# Patient Record
Sex: Female | Born: 1955
Health system: Southern US, Community
[De-identification: ages and names within clinical notes are randomized; demographics above are authoritative.]

## PROBLEM LIST (undated history)

## (undated) DIAGNOSIS — E785 Hyperlipidemia, unspecified: Secondary | ICD-10-CM

## (undated) DIAGNOSIS — G4730 Sleep apnea, unspecified: Secondary | ICD-10-CM

## (undated) DIAGNOSIS — E119 Type 2 diabetes mellitus without complications: Secondary | ICD-10-CM

## (undated) DIAGNOSIS — I10 Essential (primary) hypertension: Secondary | ICD-10-CM

## (undated) DIAGNOSIS — L0390 Cellulitis, unspecified: Secondary | ICD-10-CM

## (undated) HISTORY — DX: Hyperlipidemia, unspecified: E78.5

## (undated) HISTORY — DX: Essential (primary) hypertension: I10

## (undated) HISTORY — DX: Cellulitis, unspecified: L03.90

## (undated) HISTORY — DX: Type 2 diabetes mellitus without complications: E11.9

## (undated) HISTORY — DX: Sleep apnea, unspecified: G47.30

---

## 2007-01-29 ENCOUNTER — Ambulatory Visit: Payer: Self-pay | Admitting: Internal Medicine

## 2008-02-12 ENCOUNTER — Ambulatory Visit: Payer: Self-pay | Admitting: Internal Medicine

## 2008-11-05 ENCOUNTER — Ambulatory Visit: Payer: Self-pay | Admitting: Internal Medicine

## 2009-02-23 ENCOUNTER — Ambulatory Visit: Payer: Self-pay | Admitting: Internal Medicine

## 2009-05-20 ENCOUNTER — Ambulatory Visit: Payer: Self-pay | Admitting: Internal Medicine

## 2010-03-08 ENCOUNTER — Ambulatory Visit: Payer: Self-pay

## 2011-04-18 ENCOUNTER — Ambulatory Visit: Payer: Self-pay

## 2012-04-22 ENCOUNTER — Ambulatory Visit: Payer: Self-pay

## 2013-05-11 ENCOUNTER — Ambulatory Visit: Payer: Self-pay | Admitting: Physician Assistant

## 2014-04-06 ENCOUNTER — Emergency Department: Payer: Self-pay | Admitting: Emergency Medicine

## 2015-03-22 ENCOUNTER — Ambulatory Visit (INDEPENDENT_AMBULATORY_CARE_PROVIDER_SITE_OTHER): Payer: BLUE CROSS/BLUE SHIELD

## 2015-03-22 ENCOUNTER — Ambulatory Visit (INDEPENDENT_AMBULATORY_CARE_PROVIDER_SITE_OTHER): Payer: BLUE CROSS/BLUE SHIELD | Admitting: Podiatry

## 2015-03-22 ENCOUNTER — Telehealth: Payer: Self-pay | Admitting: *Deleted

## 2015-03-22 DIAGNOSIS — M722 Plantar fascial fibromatosis: Secondary | ICD-10-CM

## 2015-03-22 MED ORDER — MELOXICAM 15 MG PO TABS: 15.0000 mg | ORAL_TABLET | Freq: Every day | ORAL | Status: DC

## 2015-03-22 NOTE — Patient Instructions (Signed)

## 2015-03-22 NOTE — Telephone Encounter (Signed)
Pt states she was suppose to get something to stretch her foot, but did not.  I asked Hadley PenLisa Cox Dr. Gabriel RungWagoner's assistant and she said pt was to get the stretching instructions only.  I informed pt.

## 2015-03-23 DIAGNOSIS — M722 Plantar fascial fibromatosis: Secondary | ICD-10-CM | POA: Insufficient documentation

## 2015-03-23 NOTE — Progress Notes (Signed)
Subjective:     Patient ID: Makayla OvensSusan M Harrison, female   DOB: Dec 21, 1955, 59 y.o.   MRN: [id number]  HPI 59 year old female presents the also concerned to right heel pain which has been ongoing for approximately 3 weeks. She states that several years ago she did have plantar fasciitis on the same foot. She states she has a throbbing pain to the bottom of her heel mostly after she sits for long time he gets back up in the mornings or after sitting for long period time. She previously had orthotics and she is inquiring about possible new orthotics. She denies any numbness or tingling. No swelling or redness. No recent injury or trauma. No change or increased activity. The pain does not like crepitus. No other treatment recently. No other complaints.  Review of Systems  All other systems reviewed and are negative.      Objective:   Physical Exam General: AAO x3, NAD  Dermatological: Skin is warm, dry and supple bilateral. Nails x 10 are well manicured; remaining integument appears unremarkable at this time. There are no open sores, no preulcerative lesions, no rash or signs of infection present.  Vascular: Dorsalis Pedis artery and Posterior Tibial artery pedal pulses are 2/4 bilateral with immedate capillary fill time. Pedal hair growth present. No varicosities and no lower extremity edema present bilateral. There is no pain with calf compression, swelling, warmth, erythema.   Neruologic: Grossly intact via light touch bilateral. Vibratory intact via tuning fork bilateral. Protective threshold with Semmes Wienstein monofilament intact to all pedal sites bilateral. Patellar and Achilles deep tendon reflexes 2+ bilateral. No Babinski or clonus noted bilateral.   Musculoskeletal: Tenderness to palpation along the plantar medial tubercle of the calcaneus at the insertion of plantar fascia on the right foot. There is no pain along the course of the plantar fascia within the arch of the foot. Plantar fascia  appears to be intact. There is no pain with lateral compression of the calcaneus or pain with vibratory sensation. There is no pain along the course or insertion of the achilles tendon. No other areas of tenderness to bilateral lower extremities.  Muscular strength 5/5 in all groups tested bilateral.  Gait: Unassisted, Nonantalgic.      Assessment:     Patient presents her right heel pain, likely plantar fasciitis.    Plan:     -X-rays were obtained and reviewed with the patient.  -Treatment options discussed including all alternatives, risks, and complications -X-rays were obtained and reviewed with the patient.  -Patient elects to proceed with steroid injection into the right heel. Under sterile skin preparation, a total of 2.5cc of kenalog 10, 0.5% Marcaine plain, and 2% lidocaine plain were infiltrated into the symptomatic area without complication. A band-aid was applied. Patient tolerated the injection well without complication. Post-injection care with discussed with the patient. Discussed with the patient to ice the area over the next couple of days to help prevent a steroid flare.  -Plantar fascial brace -Prescribed mobic. Discussed side effects of the medication and directed to stop if any are to occur and call the office.  -Ice/stretcing -Shoegear changes -Discussed orthotics. She was scanned today, awaiting insurance verification.  -Follow-up in 3 weeks or sooner if any problems arise. In the meantime, encouraged to call the office with any questions, concerns, change in symptoms.   Makayla CurdMatthew Harrison, DPM

## 2015-03-28 ENCOUNTER — Ambulatory Visit: Payer: BLUE CROSS/BLUE SHIELD | Admitting: Podiatry

## 2015-04-12 ENCOUNTER — Ambulatory Visit: Payer: BLUE CROSS/BLUE SHIELD | Admitting: Podiatry

## 2015-05-06 ENCOUNTER — Ambulatory Visit (INDEPENDENT_AMBULATORY_CARE_PROVIDER_SITE_OTHER): Payer: BLUE CROSS/BLUE SHIELD | Admitting: *Deleted

## 2015-05-06 DIAGNOSIS — M722 Plantar fascial fibromatosis: Secondary | ICD-10-CM

## 2015-05-06 NOTE — Patient Instructions (Signed)

## 2015-05-06 NOTE — Progress Notes (Signed)
Patient presents today to pick up orthotics. Instructions were reviewed and written copy was given. Return follow up in 4 weeks with Dr. Ardelle Anton.

## 2015-07-20 DIAGNOSIS — I10 Essential (primary) hypertension: Secondary | ICD-10-CM | POA: Diagnosis not present

## 2015-07-27 DIAGNOSIS — I6603 Occlusion and stenosis of bilateral middle cerebral arteries: Secondary | ICD-10-CM | POA: Diagnosis not present

## 2015-09-13 DIAGNOSIS — E559 Vitamin D deficiency, unspecified: Secondary | ICD-10-CM | POA: Diagnosis not present

## 2015-09-13 DIAGNOSIS — E782 Mixed hyperlipidemia: Secondary | ICD-10-CM | POA: Diagnosis not present

## 2015-09-13 DIAGNOSIS — Z0001 Encounter for general adult medical examination with abnormal findings: Secondary | ICD-10-CM | POA: Diagnosis not present

## 2015-09-13 DIAGNOSIS — E1165 Type 2 diabetes mellitus with hyperglycemia: Secondary | ICD-10-CM | POA: Diagnosis not present

## 2015-09-13 DIAGNOSIS — I10 Essential (primary) hypertension: Secondary | ICD-10-CM | POA: Diagnosis not present

## 2015-09-19 DIAGNOSIS — Z0001 Encounter for general adult medical examination with abnormal findings: Secondary | ICD-10-CM | POA: Diagnosis not present

## 2015-09-19 DIAGNOSIS — E1165 Type 2 diabetes mellitus with hyperglycemia: Secondary | ICD-10-CM | POA: Diagnosis not present

## 2015-09-19 DIAGNOSIS — I10 Essential (primary) hypertension: Secondary | ICD-10-CM | POA: Diagnosis not present

## 2015-09-19 DIAGNOSIS — Z124 Encounter for screening for malignant neoplasm of cervix: Secondary | ICD-10-CM | POA: Diagnosis not present

## 2015-09-19 DIAGNOSIS — I6603 Occlusion and stenosis of bilateral middle cerebral arteries: Secondary | ICD-10-CM | POA: Diagnosis not present

## 2015-12-19 DIAGNOSIS — F4323 Adjustment disorder with mixed anxiety and depressed mood: Secondary | ICD-10-CM | POA: Diagnosis not present

## 2016-01-09 DIAGNOSIS — F4323 Adjustment disorder with mixed anxiety and depressed mood: Secondary | ICD-10-CM | POA: Diagnosis not present

## 2016-01-13 ENCOUNTER — Encounter: Payer: Self-pay | Admitting: *Deleted

## 2016-02-13 DIAGNOSIS — F4323 Adjustment disorder with mixed anxiety and depressed mood: Secondary | ICD-10-CM | POA: Diagnosis not present

## 2016-03-12 DIAGNOSIS — E782 Mixed hyperlipidemia: Secondary | ICD-10-CM | POA: Diagnosis not present

## 2016-03-12 DIAGNOSIS — I10 Essential (primary) hypertension: Secondary | ICD-10-CM | POA: Diagnosis not present

## 2016-03-12 DIAGNOSIS — E1165 Type 2 diabetes mellitus with hyperglycemia: Secondary | ICD-10-CM | POA: Diagnosis not present

## 2016-03-12 DIAGNOSIS — F4323 Adjustment disorder with mixed anxiety and depressed mood: Secondary | ICD-10-CM | POA: Diagnosis not present

## 2016-04-16 DIAGNOSIS — F4323 Adjustment disorder with mixed anxiety and depressed mood: Secondary | ICD-10-CM | POA: Diagnosis not present

## 2016-04-30 DIAGNOSIS — F4323 Adjustment disorder with mixed anxiety and depressed mood: Secondary | ICD-10-CM | POA: Diagnosis not present

## 2016-05-21 DIAGNOSIS — F4323 Adjustment disorder with mixed anxiety and depressed mood: Secondary | ICD-10-CM | POA: Diagnosis not present

## 2016-06-18 DIAGNOSIS — F4323 Adjustment disorder with mixed anxiety and depressed mood: Secondary | ICD-10-CM | POA: Diagnosis not present

## 2016-06-18 DIAGNOSIS — E1165 Type 2 diabetes mellitus with hyperglycemia: Secondary | ICD-10-CM | POA: Diagnosis not present

## 2016-06-18 DIAGNOSIS — E782 Mixed hyperlipidemia: Secondary | ICD-10-CM | POA: Diagnosis not present

## 2016-06-18 DIAGNOSIS — I10 Essential (primary) hypertension: Secondary | ICD-10-CM | POA: Diagnosis not present

## 2016-07-23 DIAGNOSIS — E1165 Type 2 diabetes mellitus with hyperglycemia: Secondary | ICD-10-CM | POA: Diagnosis not present

## 2016-07-23 DIAGNOSIS — I10 Essential (primary) hypertension: Secondary | ICD-10-CM | POA: Diagnosis not present

## 2016-08-06 ENCOUNTER — Ambulatory Visit: Payer: BLUE CROSS/BLUE SHIELD | Admitting: Medical

## 2016-08-06 ENCOUNTER — Encounter: Payer: Self-pay | Admitting: Medical

## 2016-08-06 VITALS — BP 130/80 | HR 73 | Temp 98.9°F | Resp 16 | Ht 72.0 in | Wt 306.0 lb

## 2016-08-06 DIAGNOSIS — J0110 Acute frontal sinusitis, unspecified: Secondary | ICD-10-CM

## 2016-08-06 MED ORDER — AMOXICILLIN-POT CLAVULANATE 875-125 MG PO TABS: 1.0000 | ORAL_TABLET | Freq: Two times a day (BID) | ORAL | 0 refills | Status: DC

## 2016-08-06 NOTE — Progress Notes (Signed)
   Subjective:    Patient ID: Makayla Harrison, female    DOB: [birth year], 61 y.o.   MRN: [id number]  HPI  61 yo female comes in today with cough nonproductive , 99 degree temp last night. And nasal congestion with green discharge. Sore throat started on Saturday. Facial pressure behind eyes.    Review of Systems  Constitutional: Positive for chills and fever.  HENT: Positive for congestion, ear pain, postnasal drip, rhinorrhea, sinus pain, sinus pressure, sneezing, sore throat and voice change.   Eyes: Negative for pain and itching.  Respiratory: Positive for cough. Negative for chest tightness and shortness of breath.   Cardiovascular: Negative for chest pain.  Gastrointestinal: Negative for diarrhea, nausea and vomiting.  Endocrine: Negative for cold intolerance and heat intolerance.  Genitourinary: Negative for dysuria.  Musculoskeletal: Negative for back pain and neck pain.  Skin: Negative for rash and wound.  Allergic/Immunologic: Negative for environmental allergies and food allergies.  Neurological: Negative for syncope and light-headedness.  Psychiatric/Behavioral: Negative for hallucinations. The patient is not nervous/anxious.    Cough noted in room    Objective:   Physical Exam  Constitutional: She is oriented to person, place, and time. She appears well-developed and well-nourished.  HENT:  Head: Normocephalic and atraumatic.  Right Ear: Hearing, tympanic membrane, external ear and ear canal normal.  Left Ear: Hearing, tympanic membrane, external ear and ear canal normal.  Nose: Mucosal edema and rhinorrhea present.  Mouth/Throat: Uvula is midline, oropharynx is clear and moist and mucous membranes are normal.  Eyes: EOM are normal. Pupils are equal, round, and reactive to light.  Neck: Normal range of motion. Neck supple.  Cardiovascular: Normal rate, regular rhythm and normal heart sounds.  Exam reveals no gallop and no friction rub.   No murmur  heard. Pulmonary/Chest: Effort normal and breath sounds normal.  Musculoskeletal: Normal range of motion.  Neurological: She is alert and oriented to person, place, and time.  Skin: Skin is warm and dry.  Psychiatric: She has a normal mood and affect. Her behavior is normal.  Nursing note and vitals reviewed.  Discharge in nose is green.       Assessment & Plan:  Sinusitis E-prescribed  Augmentin   one twice daily  X 10 days #20 no refills. Increase fluids. OTC Zyrtec or Claritin take as directed. Return to the clinic in 3-5 days if not improving.  Patient denies pregnancy

## 2016-08-20 DIAGNOSIS — F4323 Adjustment disorder with mixed anxiety and depressed mood: Secondary | ICD-10-CM | POA: Diagnosis not present

## 2016-10-09 DIAGNOSIS — D509 Iron deficiency anemia, unspecified: Secondary | ICD-10-CM | POA: Diagnosis not present

## 2016-10-09 DIAGNOSIS — E559 Vitamin D deficiency, unspecified: Secondary | ICD-10-CM | POA: Diagnosis not present

## 2016-10-09 DIAGNOSIS — I10 Essential (primary) hypertension: Secondary | ICD-10-CM | POA: Diagnosis not present

## 2016-10-09 DIAGNOSIS — Z0001 Encounter for general adult medical examination with abnormal findings: Secondary | ICD-10-CM | POA: Diagnosis not present

## 2016-10-09 DIAGNOSIS — E782 Mixed hyperlipidemia: Secondary | ICD-10-CM | POA: Diagnosis not present

## 2016-10-15 DIAGNOSIS — E782 Mixed hyperlipidemia: Secondary | ICD-10-CM | POA: Diagnosis not present

## 2016-10-15 DIAGNOSIS — B079 Viral wart, unspecified: Secondary | ICD-10-CM | POA: Diagnosis not present

## 2016-10-15 DIAGNOSIS — E1165 Type 2 diabetes mellitus with hyperglycemia: Secondary | ICD-10-CM | POA: Diagnosis not present

## 2016-10-15 DIAGNOSIS — I10 Essential (primary) hypertension: Secondary | ICD-10-CM | POA: Diagnosis not present

## 2016-12-31 DIAGNOSIS — F4323 Adjustment disorder with mixed anxiety and depressed mood: Secondary | ICD-10-CM | POA: Diagnosis not present

## 2017-01-14 DIAGNOSIS — Z0001 Encounter for general adult medical examination with abnormal findings: Secondary | ICD-10-CM | POA: Diagnosis not present

## 2017-01-14 DIAGNOSIS — B359 Dermatophytosis, unspecified: Secondary | ICD-10-CM | POA: Diagnosis not present

## 2017-01-14 DIAGNOSIS — E11621 Type 2 diabetes mellitus with foot ulcer: Secondary | ICD-10-CM | POA: Diagnosis not present

## 2017-01-14 DIAGNOSIS — I10 Essential (primary) hypertension: Secondary | ICD-10-CM | POA: Diagnosis not present

## 2017-02-04 DIAGNOSIS — F4323 Adjustment disorder with mixed anxiety and depressed mood: Secondary | ICD-10-CM | POA: Diagnosis not present

## 2017-02-18 DIAGNOSIS — B351 Tinea unguium: Secondary | ICD-10-CM | POA: Diagnosis not present

## 2017-02-18 DIAGNOSIS — E119 Type 2 diabetes mellitus without complications: Secondary | ICD-10-CM | POA: Diagnosis not present

## 2017-02-18 DIAGNOSIS — M2042 Other hammer toe(s) (acquired), left foot: Secondary | ICD-10-CM | POA: Diagnosis not present

## 2017-02-18 DIAGNOSIS — M2041 Other hammer toe(s) (acquired), right foot: Secondary | ICD-10-CM | POA: Diagnosis not present

## 2017-02-18 DIAGNOSIS — Z1231 Encounter for screening mammogram for malignant neoplasm of breast: Secondary | ICD-10-CM | POA: Diagnosis not present

## 2017-02-26 ENCOUNTER — Other Ambulatory Visit: Payer: Self-pay | Admitting: Nurse Practitioner

## 2017-02-27 ENCOUNTER — Inpatient Hospital Stay
Admission: RE | Admit: 2017-02-27 | Discharge: 2017-02-27 | Disposition: A | Discharge status: Home / Self Care | Payer: Self-pay | Source: Ambulatory Visit | Attending: *Deleted | Admitting: *Deleted

## 2017-02-27 ENCOUNTER — Other Ambulatory Visit: Payer: Self-pay | Admitting: *Deleted

## 2017-02-27 DIAGNOSIS — Z9289 Personal history of other medical treatment: Secondary | ICD-10-CM

## 2017-04-15 ENCOUNTER — Ambulatory Visit: Payer: Self-pay | Admitting: Nurse Practitioner

## 2017-04-15 ENCOUNTER — Other Ambulatory Visit: Payer: Self-pay

## 2017-04-15 DIAGNOSIS — I6603 Occlusion and stenosis of bilateral middle cerebral arteries: Secondary | ICD-10-CM | POA: Insufficient documentation

## 2017-04-15 DIAGNOSIS — E1165 Type 2 diabetes mellitus with hyperglycemia: Secondary | ICD-10-CM | POA: Insufficient documentation

## 2017-04-15 DIAGNOSIS — E782 Mixed hyperlipidemia: Secondary | ICD-10-CM | POA: Insufficient documentation

## 2017-04-15 DIAGNOSIS — E11621 Type 2 diabetes mellitus with foot ulcer: Secondary | ICD-10-CM | POA: Insufficient documentation

## 2017-04-15 DIAGNOSIS — B359 Dermatophytosis, unspecified: Secondary | ICD-10-CM | POA: Insufficient documentation

## 2017-04-15 DIAGNOSIS — R0602 Shortness of breath: Secondary | ICD-10-CM | POA: Insufficient documentation

## 2017-04-15 DIAGNOSIS — I2510 Atherosclerotic heart disease of native coronary artery without angina pectoris: Secondary | ICD-10-CM | POA: Insufficient documentation

## 2017-04-15 DIAGNOSIS — F4323 Adjustment disorder with mixed anxiety and depressed mood: Secondary | ICD-10-CM | POA: Diagnosis not present

## 2017-04-15 DIAGNOSIS — N958 Other specified menopausal and perimenopausal disorders: Secondary | ICD-10-CM | POA: Insufficient documentation

## 2017-04-15 DIAGNOSIS — R079 Chest pain, unspecified: Secondary | ICD-10-CM | POA: Insufficient documentation

## 2017-04-15 DIAGNOSIS — I10 Essential (primary) hypertension: Secondary | ICD-10-CM | POA: Insufficient documentation

## 2017-04-15 DIAGNOSIS — E559 Vitamin D deficiency, unspecified: Secondary | ICD-10-CM | POA: Insufficient documentation

## 2017-04-15 DIAGNOSIS — I2583 Coronary atherosclerosis due to lipid rich plaque: Secondary | ICD-10-CM

## 2017-04-18 ENCOUNTER — Other Ambulatory Visit: Payer: Self-pay | Admitting: Nurse Practitioner

## 2017-04-18 ENCOUNTER — Telehealth: Payer: Self-pay

## 2017-04-18 DIAGNOSIS — R922 Inconclusive mammogram: Secondary | ICD-10-CM

## 2017-04-18 NOTE — Telephone Encounter (Signed)
Pt called regarding her second breast exam order. [facility] was sent [facility]/17, [facility]. Advised pt to call them in a few hours to check if they received it.

## 2017-04-25 ENCOUNTER — Other Ambulatory Visit: Payer: Self-pay

## 2017-04-29 ENCOUNTER — Encounter (HOSPITAL_COMMUNITY): Payer: Self-pay

## 2017-04-29 ENCOUNTER — Ambulatory Visit
Admission: RE | Admit: 2017-04-29 | Discharge: 2017-04-29 | Disposition: A | Discharge status: Home / Self Care | Payer: BLUE CROSS/BLUE SHIELD | Source: Ambulatory Visit | Attending: Nurse Practitioner | Admitting: Nurse Practitioner

## 2017-04-29 DIAGNOSIS — R922 Inconclusive mammogram: Secondary | ICD-10-CM

## 2017-04-29 DIAGNOSIS — N6459 Other signs and symptoms in breast: Secondary | ICD-10-CM | POA: Diagnosis not present

## 2017-04-29 DIAGNOSIS — R928 Other abnormal and inconclusive findings on diagnostic imaging of breast: Secondary | ICD-10-CM | POA: Diagnosis not present

## 2017-05-13 DIAGNOSIS — F4323 Adjustment disorder with mixed anxiety and depressed mood: Secondary | ICD-10-CM | POA: Diagnosis not present

## 2017-05-27 ENCOUNTER — Other Ambulatory Visit: Payer: Self-pay

## 2017-05-27 MED ORDER — TERBINAFINE HCL 250 MG PO TABS: 250.0000 mg | ORAL_TABLET | Freq: Every day | ORAL | 1 refills | Status: DC

## 2017-06-10 DIAGNOSIS — F4323 Adjustment disorder with mixed anxiety and depressed mood: Secondary | ICD-10-CM | POA: Diagnosis not present

## 2017-07-15 DIAGNOSIS — F4323 Adjustment disorder with mixed anxiety and depressed mood: Secondary | ICD-10-CM | POA: Diagnosis not present

## 2017-09-23 DIAGNOSIS — F4323 Adjustment disorder with mixed anxiety and depressed mood: Secondary | ICD-10-CM | POA: Diagnosis not present

## 2017-09-30 ENCOUNTER — Ambulatory Visit: Payer: Self-pay | Admitting: Nurse Practitioner

## 2017-10-10 ENCOUNTER — Other Ambulatory Visit: Payer: Self-pay | Admitting: Internal Medicine

## 2017-10-11 ENCOUNTER — Other Ambulatory Visit: Payer: Self-pay | Admitting: Internal Medicine

## 2017-11-11 ENCOUNTER — Ambulatory Visit: Payer: Self-pay | Admitting: Nurse Practitioner

## 2017-11-25 ENCOUNTER — Other Ambulatory Visit: Payer: Self-pay

## 2017-11-25 MED ORDER — METFORMIN HCL 1000 MG PO TABS: 1000.0000 mg | ORAL_TABLET | Freq: Two times a day (BID) | ORAL | 1 refills | Status: DC

## 2017-11-27 ENCOUNTER — Other Ambulatory Visit: Payer: Self-pay | Admitting: Internal Medicine

## 2017-12-01 ENCOUNTER — Other Ambulatory Visit: Payer: Self-pay | Admitting: Internal Medicine

## 2017-12-02 ENCOUNTER — Other Ambulatory Visit: Payer: Self-pay

## 2017-12-02 MED ORDER — LISINOPRIL 10 MG PO TABS: 10.0000 mg | ORAL_TABLET | Freq: Every day | ORAL | 1 refills | Status: DC

## 2017-12-16 ENCOUNTER — Ambulatory Visit: Payer: BLUE CROSS/BLUE SHIELD | Admitting: Nurse Practitioner

## 2017-12-16 ENCOUNTER — Encounter: Payer: Self-pay | Admitting: Nurse Practitioner

## 2017-12-16 VITALS — BP 138/70 | HR 67 | Resp 16 | Ht 72.0 in | Wt 307.4 lb

## 2017-12-16 DIAGNOSIS — E1165 Type 2 diabetes mellitus with hyperglycemia: Secondary | ICD-10-CM | POA: Diagnosis not present

## 2017-12-16 DIAGNOSIS — I10 Essential (primary) hypertension: Secondary | ICD-10-CM

## 2017-12-16 DIAGNOSIS — Z0001 Encounter for general adult medical examination with abnormal findings: Secondary | ICD-10-CM

## 2017-12-16 DIAGNOSIS — E782 Mixed hyperlipidemia: Secondary | ICD-10-CM | POA: Diagnosis not present

## 2017-12-16 DIAGNOSIS — I6523 Occlusion and stenosis of bilateral carotid arteries: Secondary | ICD-10-CM | POA: Diagnosis not present

## 2017-12-16 DIAGNOSIS — Z1239 Encounter for other screening for malignant neoplasm of breast: Secondary | ICD-10-CM

## 2017-12-16 DIAGNOSIS — E559 Vitamin D deficiency, unspecified: Secondary | ICD-10-CM

## 2017-12-16 DIAGNOSIS — Z1231 Encounter for screening mammogram for malignant neoplasm of breast: Secondary | ICD-10-CM

## 2017-12-16 LAB — POCT GLYCOSYLATED HEMOGLOBIN (HGB A1C): HEMOGLOBIN A1C: 7.3 % — AB (ref 4.0–5.6)

## 2017-12-16 NOTE — Progress Notes (Signed)
[facility] [address] [place], [place] [address]  Internal MEDICINE  Office Visit Note  Patient Name: Makayla Harrison  [id number]  [id number]  Date of Service: 12/25/2017  Chief Complaint  Patient presents with  . Diabetes  . Foot Pain    right     Diabetes  She presents for her follow-up diabetic visit. She has type 2 diabetes mellitus. No MedicAlert identification noted. Her disease course has been stable. There are no hypoglycemic associated symptoms. Pertinent negatives for hypoglycemia include no dizziness, headaches, nervousness/anxiousness or tremors. There are no diabetic associated symptoms. Pertinent negatives for diabetes include no chest pain, no fatigue, no polydipsia, no polyphagia and no polyuria. There are no hypoglycemic complications. Symptoms are stable. Diabetic complications include PVD. Risk factors for coronary artery disease include diabetes mellitus, dyslipidemia, hypertension, obesity, post-menopausal and sedentary lifestyle. Current diabetic treatment includes oral agent (dual therapy). She is compliant with treatment all of the time. Her weight is stable. She is following a generally healthy diet. Meal planning includes avoidance of concentrated sweets. She has not had a previous visit with a dietitian. She participates in exercise intermittently. There is no change in her home blood glucose trend. An ACE inhibitor/angiotensin II receptor blocker is being taken. She does not see a podiatrist.Eye exam is current.       Current Medication: Outpatient Encounter Medications as of 12/16/2017  Medication Sig  . aspirin 81 MG chewable tablet Chew 81 mg by mouth daily.  . carvedilol (COREG) 25 MG tablet TAKE 1 TABLET BY MOUTH TWICE A DAY AS NEEDED FOR BLOOD PRESSURE  . cephALEXin (KEFLEX) 500 MG capsule Take 500 mg by mouth 4 (four) times daily.  . famciclovir (FAMVIR) 500 MG tablet Take 500 mg by mouth 2 (two) times daily.  Marland Kitchen glimepiride (AMARYL) 1 MG  tablet TAKE 1 TABLET BY MOUTH EVERY DAY  . glucose blood test strip 1 each by Other route as needed for other. Use as instructed  . Insulin Pen Needle (NOVOFINE PLUS) 32G X 4 MM MISC by Does not apply route.  Marland Kitchen LIPITOR 20 MG tablet TAKE 1 TABLET AT PM WITH SUPPER - NAME BRAND IS MEDICALLY NECESSARY  . lisinopril (PRINIVIL,ZESTRIL) 10 MG tablet TAKE 1 TABLET BY MOUTH EVERY DAY  . lisinopril (PRINIVIL,ZESTRIL) 10 MG tablet Take 1 tablet (10 mg total) by mouth daily.  . metFORMIN (GLUCOPHAGE) 1000 MG tablet Take 1 tablet (1,000 mg total) by mouth 2 (two) times daily.  Marland Kitchen terbinafine (LAMISIL) 250 MG tablet Take 1 tablet (250 mg total) by mouth daily.  Marland Kitchen amoxicillin-clavulanate (AUGMENTIN) 875-125 MG tablet Take 1 tablet by mouth 2 (two) times daily. Take with food (Patient not taking: Reported on 12/16/2017)   No facility-administered encounter medications on file as of 12/16/2017.     Surgical History: History reviewed. No pertinent surgical history.  Medical History: Past Medical History:  Diagnosis Date  . Cellulitis   . Diabetes mellitus without complication (HCC)   . Hyperlipidemia   . Hypertension   . Sleep apnea     Family History: Family History  Problem Relation Age of Onset  . Breast cancer Neg Hx   . Osteoarthritis Neg Hx   . Hypertension Neg Hx   . Renal cancer Neg Hx   . Heart disease Neg Hx   . Diabetes Neg Hx   . Hyperlipidemia Neg Hx     Social History   Socioeconomic History  . Marital status: Married    Spouse name:  Not on file  . Number of children: Not on file  . Years of education: Not on file  . Highest education level: Not on file  Occupational History  . Not on file  Social Needs  . Financial resource strain: Not on file  . Food insecurity:    Worry: Not on file    Inability: Not on file  . Transportation needs:    Medical: Not on file    Non-medical: Not on file  Tobacco Use  . Smoking status: Former Games developer  . Smokeless tobacco: Never Used   Substance and Sexual Activity  . Alcohol use: Yes    Comment: many years ago socially  . Drug use: No  . Sexual activity: Not on file  Lifestyle  . Physical activity:    Days per week: Not on file    Minutes per session: Not on file  . Stress: Not on file  Relationships  . Social connections:    Talks on phone: Not on file    Gets together: Not on file    Attends religious service: Not on file    Active member of club or organization: Not on file    Attends meetings of clubs or organizations: Not on file    Relationship status: Not on file  . Intimate partner violence:    Fear of current or ex partner: Not on file    Emotionally abused: Not on file    Physically abused: Not on file    Forced sexual activity: Not on file  Other Topics Concern  . Not on file  Social History Narrative  . Not on file      Review of Systems  Constitutional: Negative for chills, fatigue and unexpected weight change.  HENT: Negative for congestion, postnasal drip, rhinorrhea, sneezing and sore throat.   Eyes: Negative.  Negative for redness.  Respiratory: Negative for cough, chest tightness, shortness of breath and wheezing.   Cardiovascular: Negative for chest pain and palpitations.  Gastrointestinal: Negative for abdominal pain, constipation, diarrhea, nausea and vomiting.  Endocrine: Negative for cold intolerance, heat intolerance, polydipsia, polyphagia and polyuria.  Genitourinary: Negative for dysuria and frequency.  Musculoskeletal: Positive for arthralgias. Negative for back pain, joint swelling and neck pain.       Patient mentions bilateral foot pain, worse after periods of rest. Gets better as she is walking for a while. Ha had plantar fasciitis in the past and pain she has now feels very similar.   Skin: Negative for rash.  Allergic/Immunologic: Negative for environmental allergies.  Neurological: Negative.  Negative for dizziness, tremors, numbness and headaches.  Hematological:  Negative for adenopathy. Does not bruise/bleed easily.  Psychiatric/Behavioral: Negative for behavioral problems (Depression), sleep disturbance and suicidal ideas. The patient is not nervous/anxious.     Today's Vitals   12/16/17 1146  BP: 138/70  Pulse: 67  Resp: 16  SpO2: 98%  Weight: (!) 307 lb 6.4 oz (139.4 kg)  Height: 6' (1.829 m)    Physical Exam  Constitutional: She is oriented to person, place, and time. She appears well-developed and well-nourished. No distress.  HENT:  Head: Normocephalic and atraumatic.  Nose: Nose normal.  Mouth/Throat: Oropharynx is clear and moist. No oropharyngeal exudate.  Eyes: Pupils are equal, round, and reactive to light. Conjunctivae and EOM are normal.  Neck: Normal range of motion. Neck supple. No JVD present. No tracheal deviation present. No thyromegaly present.  Cardiovascular: Normal rate, regular rhythm and normal heart sounds. Exam reveals no  gallop and no friction rub.  No murmur heard. Pulmonary/Chest: Effort normal and breath sounds normal. No respiratory distress. She has no wheezes. She has no rales. She exhibits no tenderness.  Abdominal: Soft. Bowel sounds are normal. There is no tenderness.  Musculoskeletal: Normal range of motion.  Lymphadenopathy:    She has no cervical adenopathy.  Neurological: She is alert and oriented to person, place, and time. No cranial nerve deficit.  Skin: Skin is warm and dry. She is not diaphoretic.  Psychiatric: She has a normal mood and affect. Her behavior is normal. Judgment and thought content normal.  Nursing note and vitals reviewed.  Assessment/Plan: 1. Uncontrolled type 2 diabetes mellitus with hyperglycemia (HCC) - POCT HgB A1C 7.3 today. Continue oral diabetic medication as prescribed. Reviewed importance of following diabetic diet and exercising regularly.  - Comprehensive metabolic panel - T4, free - TSH  2. Essential hypertension Stable. Continue bp medications as prescribed.    3. Mixed hyperlipidemia - Lipid panel  4. Bilateral carotid artery occlusion Repeat carotid artery doppler for continued monitoring.  - US Carotid Duplex Bilateral; Future  5. Vitamin D deficiency - Vitamin D 1,25 dihydroxy  6. Screening for breast cancer - MM DIGITAL SCREENING BILATERAL; Future   General Counseling: doranne schmutz understanding of the findings of todays visit and agrees with plan of treatment. I have discussed any further diagnostic evaluation that may be needed or ordered today. We also reviewed her medications today. she has been encouraged to call the office with any questions or concerns that should arise related to todays visit.  Diabetes Counseling:  1. Addition of ACE inh/ ARB'S for nephroprotection. Microalbumin is updated  2. Diabetic foot care, prevention of complications. Podiatry consult 3. Exercise and lose weight.  4. Diabetic eye examination, Diabetic eye exam is updated  5. Monitor blood sugar closlely. nutrition counseling.  6. Sign and symptoms of hypoglycemia including shaking sweating,confusion and headaches.  This patient was seen by Vincent Gros FNP Collaboration with Dr Lyndon Code as a part of collaborative care agreement  Orders Placed This Encounter  Procedures  . MM DIGITAL SCREENING BILATERAL  . US Carotid Duplex Bilateral  . CBC with Differential/Platelet  . Comprehensive metabolic panel  . T4, free  . TSH  . Lipid panel  . Vitamin D 1,25 dihydroxy  . POCT HgB A1C      Time spent: 20 Minutes      Dr Lyndon Code Internal medicine

## 2017-12-25 DIAGNOSIS — I6523 Occlusion and stenosis of bilateral carotid arteries: Secondary | ICD-10-CM | POA: Insufficient documentation

## 2017-12-25 DIAGNOSIS — Z0001 Encounter for general adult medical examination with abnormal findings: Secondary | ICD-10-CM | POA: Insufficient documentation

## 2017-12-25 DIAGNOSIS — E1165 Type 2 diabetes mellitus with hyperglycemia: Secondary | ICD-10-CM | POA: Insufficient documentation

## 2018-01-16 ENCOUNTER — Other Ambulatory Visit: Payer: Self-pay | Admitting: Nurse Practitioner

## 2018-01-16 DIAGNOSIS — B372 Candidiasis of skin and nail: Secondary | ICD-10-CM

## 2018-01-16 MED ORDER — CLOTRIMAZOLE-BETAMETHASONE 1-0.05 % EX CREA: 1.0000 "application " | TOPICAL_CREAM | Freq: Two times a day (BID) | CUTANEOUS | 2 refills | Status: DC

## 2018-01-16 NOTE — Progress Notes (Signed)
Patient c/o rash underneath both breasts and on abdomen. Sent prescription for lotrisone cream. Apply to all affected areas bid. Sent to [facility] [address]

## 2018-01-20 DIAGNOSIS — F4323 Adjustment disorder with mixed anxiety and depressed mood: Secondary | ICD-10-CM | POA: Diagnosis not present

## 2018-02-07 ENCOUNTER — Ambulatory Visit: Payer: Self-pay

## 2018-02-07 DIAGNOSIS — I6523 Occlusion and stenosis of bilateral carotid arteries: Secondary | ICD-10-CM

## 2018-02-26 NOTE — Procedures (Signed)
[facility] [address] [place], [place] [id number]  DATE OF SERVICE: February 07, 2018  CAROTID DOPPLER INTERPRETATION:  Bilateral Carotid Ultrsasound and Color Doppler Examination was performed. The RIGHT CCA shows mild plaque in the vessel. The LEFT CCA shows no plaque in the vessel. There was no intimal thickening noted in the RIGHT carotid artery. There was no intimal thickening in the LEFT carotid artery.  The RIGHT CCA shows peak systolic velocity of 57 cm per second. The end diastolic velocity is 15 cm per second on the RIGHT side. The RIGHT ICA shows peak systolic velocity of 103 per second. RIGHT sided ICA end diastolic velocity is 45 cm per second. The RIGHT ECA shows a peak systolic velocity of 84 cm per second. The ICA/CCA ratio is calculated to be 1.8. This suggests less than 50% stenosis. The Vertebral Artery shows antegrade flow.  The LEFT CCA shows peak systolic velocity of 76 cm per second. The end diastolic velocity is 23 cm per second on the LEFT side. The LEFT ICA shows peak systolic velocity of 91 per second. LEFT sided ICA end diastolic velocity is 33 cm per second. The LEFT ECA shows a peak systolic velocity of 99 cm per second. The ICA/CCA ratio is calculated to be 1.19. This suggests less than 50% stenosis. The Vertebral Artery shows antegrade flow.   Impression:    The RIGHT CAROTID shows less than 50% stenosis. The LEFT CAROTID shows less than 50% stenosis.  There is no plaque formation noted on the LEFT and mild on the RIGHT  side. Consider a repeat Carotid doppler if clinical situation and symptoms warrant in 6-12 months. Patient should be encouraged to change lifestyles such as smoking cessation, regular exercise and dietary modification. Use of statins in the right clinical setting and ASA is encouraged.  Yevonne PaxSaadat A Khan, MD [facility] Pulmonary Critical Care Medicine

## 2018-03-03 ENCOUNTER — Ambulatory Visit
Admission: RE | Admit: 2018-03-03 | Discharge: 2018-03-03 | Disposition: A | Discharge status: Home / Self Care | Payer: BLUE CROSS/BLUE SHIELD | Source: Ambulatory Visit | Attending: Nurse Practitioner | Admitting: Nurse Practitioner

## 2018-03-03 DIAGNOSIS — Z1239 Encounter for other screening for malignant neoplasm of breast: Secondary | ICD-10-CM | POA: Diagnosis not present

## 2018-03-03 DIAGNOSIS — Z1231 Encounter for screening mammogram for malignant neoplasm of breast: Secondary | ICD-10-CM | POA: Diagnosis not present

## 2018-04-23 DIAGNOSIS — F4323 Adjustment disorder with mixed anxiety and depressed mood: Secondary | ICD-10-CM | POA: Diagnosis not present

## 2018-05-26 ENCOUNTER — Other Ambulatory Visit: Payer: Self-pay

## 2018-05-26 MED ORDER — METFORMIN HCL 1000 MG PO TABS: 1000.0000 mg | ORAL_TABLET | Freq: Two times a day (BID) | ORAL | 1 refills | Status: DC

## 2018-06-30 ENCOUNTER — Ambulatory Visit: Payer: Self-pay | Admitting: Nurse Practitioner

## 2018-07-01 ENCOUNTER — Telehealth (INDEPENDENT_AMBULATORY_CARE_PROVIDER_SITE_OTHER): Payer: BLUE CROSS/BLUE SHIELD | Admitting: Nurse Practitioner

## 2018-07-01 DIAGNOSIS — E782 Mixed hyperlipidemia: Secondary | ICD-10-CM

## 2018-07-01 DIAGNOSIS — I10 Essential (primary) hypertension: Secondary | ICD-10-CM

## 2018-07-01 DIAGNOSIS — E1165 Type 2 diabetes mellitus with hyperglycemia: Secondary | ICD-10-CM

## 2018-07-01 MED ORDER — GLIMEPIRIDE 1 MG PO TABS: 1.0000 mg | ORAL_TABLET | Freq: Every day | ORAL | 0 refills | Status: DC

## 2018-07-01 MED ORDER — CARVEDILOL 25 MG PO TABS: ORAL_TABLET | ORAL | 0 refills | Status: DC

## 2018-07-01 MED ORDER — LIPITOR 20 MG PO TABS: ORAL_TABLET | ORAL | 0 refills | Status: DC

## 2018-07-01 MED ORDER — LISINOPRIL 10 MG PO TABS: 10.0000 mg | ORAL_TABLET | Freq: Every day | ORAL | 0 refills | Status: DC

## 2018-07-01 MED ORDER — METFORMIN HCL 1000 MG PO TABS: 1000.0000 mg | ORAL_TABLET | Freq: Two times a day (BID) | ORAL | 0 refills | Status: DC

## 2018-07-01 NOTE — Telephone Encounter (Signed)
-----   Message from [place] sent at 07/01/2018  9:10 AM EDT ----- Patient reschd her appt bc she does not want to come out, she does need refills on medications and especially her lisinopril Switched from [facility] [address] she now uses [facility] [address]. Please send updated refills to [facility] [address]

## 2018-07-21 ENCOUNTER — Ambulatory Visit: Payer: Self-pay | Admitting: Nurse Practitioner

## 2018-09-02 ENCOUNTER — Ambulatory Visit: Payer: Self-pay | Admitting: Nurse Practitioner

## 2018-09-15 ENCOUNTER — Other Ambulatory Visit: Payer: Self-pay

## 2018-09-15 ENCOUNTER — Telehealth: Payer: BLUE CROSS/BLUE SHIELD | Admitting: Medical

## 2018-09-15 DIAGNOSIS — J019 Acute sinusitis, unspecified: Secondary | ICD-10-CM

## 2018-09-15 DIAGNOSIS — R062 Wheezing: Secondary | ICD-10-CM

## 2018-09-15 DIAGNOSIS — J069 Acute upper respiratory infection, unspecified: Secondary | ICD-10-CM

## 2018-09-15 MED ORDER — AMOXICILLIN 875 MG PO TABS: 875.0000 mg | ORAL_TABLET | Freq: Two times a day (BID) | ORAL | 0 refills | Status: DC

## 2018-09-15 MED ORDER — ALBUTEROL SULFATE HFA 108 (90 BASE) MCG/ACT IN AERS: 2.0000 | INHALATION_SPRAY | Freq: Four times a day (QID) | RESPIRATORY_TRACT | 0 refills | Status: AC | PRN

## 2018-09-15 NOTE — Progress Notes (Signed)
63 yo female in non acute distress. Gives permission for telemedicine appt.  Starting Friday with cough worsening over weekend productive Green, Wheezing, no SOB No chest. Denies fever chills. Left ear feels irritated today only. Nasal congestion today.  She has had diarrhea this morning.Though she usually goes back and forth with regular stool and diarrhea as her baseline. Denies sore throat, bodyaches or fever/chills, or headache. Today she feels as if the infection is more in her head then in her chest.   Has had Ventolin in the past with Bronchitis symptoms.   History of Pneumonia>5 years ago. Not hospitalized. History of Bronchitis though not yearly.  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  Wants to do Amoxil, Augmentin gives her diarrhea. Would like an Albuterol  MDI.  Using Delsym DM as directed for cough with relief. Patient is a . Non smoker.  PE: patient alert and oriented, has a hoarse voice. No other  Physical exam was done due to being a televisit.   A&P DX URI w/ wheezing and Sinusitis, laryngitis.   Rest and increase fluids.  Meds ordered this encounter  Medications  . amoxicillin (AMOXIL) 875 MG tablet    Sig: Take 1 tablet (875 mg total) by mouth 2 (two) times daily.    Dispense:  20 tablet    Refill:  0  . albuterol (VENTOLIN HFA) 108 (90 Base) MCG/ACT inhaler    Sig: Inhale 2 puffs into the lungs every 6 (six) hours as needed for wheezing or shortness of breath. Or cough, pleasse give ventolin.    Dispense:  1 Inhaler    Refill:  0   To call if worsening in the next 49-72 hours or if you have CP, SOB, ST. bodyaches. May take OTC Tylenol as needed( as directed on the bottle). for discomfort if fever 100.4 or higher or other symptoms we have reviewe to call the office or follow up with her PCP, or any other concerns. She verbalizes understanding and has no  questions at discharge.

## 2018-09-18 NOTE — Progress Notes (Signed)
Yes. Wlll discuss with her at visit 7/2 and set up surveillance carotid doppler.

## 2018-09-24 ENCOUNTER — Telehealth: Payer: Self-pay | Admitting: General Practice

## 2018-09-24 DIAGNOSIS — Z20822 Contact with and (suspected) exposure to covid-19: Secondary | ICD-10-CM

## 2018-09-24 NOTE — Telephone Encounter (Signed)
-----   Message from Lenon Oms, [place] sent at 09/24/2018  2:49 PM EDT ----- Please contact patient to  schedule for covid test at  [phone] Thank you

## 2018-09-24 NOTE — Telephone Encounter (Signed)
Pt has been scheduled for covid testing.  Scheduled with pt directly.  Pt was referred by: Ronnell Freshwater, NP

## 2018-09-24 NOTE — Addendum Note (Signed)
Addended by: Dimple Nanas on: 09/24/2018 03:58 PM   Modules accepted: Orders

## 2018-09-25 ENCOUNTER — Other Ambulatory Visit: Payer: BLUE CROSS/BLUE SHIELD

## 2018-09-25 DIAGNOSIS — Z20822 Contact with and (suspected) exposure to covid-19: Secondary | ICD-10-CM

## 2018-09-27 LAB — NOVEL CORONAVIRUS, NAA: SARS-CoV-2, NAA: NOT DETECTED

## 2018-09-29 ENCOUNTER — Telehealth: Payer: Self-pay

## 2018-09-29 ENCOUNTER — Other Ambulatory Visit: Payer: Self-pay

## 2018-09-29 NOTE — Telephone Encounter (Signed)
Pt advised labs for covid 19 test is negative

## 2018-10-03 ENCOUNTER — Other Ambulatory Visit: Payer: Self-pay | Admitting: Nurse Practitioner

## 2018-10-03 DIAGNOSIS — E559 Vitamin D deficiency, unspecified: Secondary | ICD-10-CM | POA: Diagnosis not present

## 2018-10-03 DIAGNOSIS — E1165 Type 2 diabetes mellitus with hyperglycemia: Secondary | ICD-10-CM | POA: Diagnosis not present

## 2018-10-03 DIAGNOSIS — I10 Essential (primary) hypertension: Secondary | ICD-10-CM | POA: Diagnosis not present

## 2018-10-03 DIAGNOSIS — E782 Mixed hyperlipidemia: Secondary | ICD-10-CM | POA: Diagnosis not present

## 2018-10-03 DIAGNOSIS — Z0001 Encounter for general adult medical examination with abnormal findings: Secondary | ICD-10-CM | POA: Diagnosis not present

## 2018-10-04 LAB — CBC
Hematocrit: 35.2 % (ref 34.0–46.6)
Hemoglobin: 11.8 g/dL (ref 11.1–15.9)
MCH: 29.2 pg (ref 26.6–33.0)
MCHC: 33.5 g/dL (ref 31.5–35.7)
MCV: 87 fL (ref 79–97)
Platelets: 279 10*3/uL (ref 150–450)
RBC: 4.04 x10E6/uL (ref 3.77–5.28)
RDW: 13 % (ref 11.7–15.4)
WBC: 6.7 10*3/uL (ref 3.4–10.8)

## 2018-10-04 LAB — TSH: TSH: 1.54 u[IU]/mL (ref 0.450–4.500)

## 2018-10-04 LAB — COMPREHENSIVE METABOLIC PANEL
ALT: 22 IU/L (ref 0–32)
AST: 14 IU/L (ref 0–40)
Albumin/Globulin Ratio: 1.9 (ref 1.2–2.2)
Albumin: 4.4 g/dL (ref 3.8–4.8)
Alkaline Phosphatase: 64 IU/L (ref 39–117)
BUN/Creatinine Ratio: 17 (ref 12–28)
BUN: 15 mg/dL (ref 8–27)
Bilirubin Total: 0.3 mg/dL (ref 0.0–1.2)
CO2: 20 mmol/L (ref 20–29)
Calcium: 9.5 mg/dL (ref 8.7–10.3)
Chloride: 101 mmol/L (ref 96–106)
Creatinine, Ser: 0.89 mg/dL (ref 0.57–1.00)
GFR calc Af Amer: 81 mL/min/{1.73_m2} (ref >59)
GFR calc non Af Amer: 70 mL/min/{1.73_m2} (ref >59)
Globulin, Total: 2.3 g/dL (ref 1.5–4.5)
Glucose: 150 mg/dL — ABNORMAL HIGH (ref 65–99)
Potassium: 5.1 mmol/L (ref 3.5–5.2)
Sodium: 137 mmol/L (ref 134–144)
Total Protein: 6.7 g/dL (ref 6.0–8.5)

## 2018-10-04 LAB — VITAMIN D 25 HYDROXY (VIT D DEFICIENCY, FRACTURES): Vit D, 25-Hydroxy: 17 ng/mL — ABNORMAL LOW (ref 30.0–100.0)

## 2018-10-04 LAB — LIPID PANEL W/O CHOL/HDL RATIO
Cholesterol, Total: 151 mg/dL (ref 100–199)
HDL: 51 mg/dL (ref >39)
LDL Calculated: 73 mg/dL (ref 0–99)
Triglycerides: 135 mg/dL (ref 0–149)
VLDL Cholesterol Cal: 27 mg/dL (ref 5–40)

## 2018-10-04 LAB — T4, FREE: Free T4: 1.19 ng/dL (ref 0.82–1.77)

## 2018-10-09 ENCOUNTER — Other Ambulatory Visit: Payer: Self-pay

## 2018-10-09 ENCOUNTER — Ambulatory Visit: Payer: BC Managed Care – PPO | Admitting: Nurse Practitioner

## 2018-10-09 ENCOUNTER — Encounter: Payer: Self-pay | Admitting: Nurse Practitioner

## 2018-10-09 VITALS — Ht 72.0 in | Wt 319.0 lb

## 2018-10-09 DIAGNOSIS — E1165 Type 2 diabetes mellitus with hyperglycemia: Secondary | ICD-10-CM

## 2018-10-09 DIAGNOSIS — I6523 Occlusion and stenosis of bilateral carotid arteries: Secondary | ICD-10-CM | POA: Diagnosis not present

## 2018-10-09 DIAGNOSIS — E559 Vitamin D deficiency, unspecified: Secondary | ICD-10-CM

## 2018-10-09 DIAGNOSIS — E782 Mixed hyperlipidemia: Secondary | ICD-10-CM | POA: Diagnosis not present

## 2018-10-09 DIAGNOSIS — I10 Essential (primary) hypertension: Secondary | ICD-10-CM | POA: Diagnosis not present

## 2018-10-09 MED ORDER — CARVEDILOL 25 MG PO TABS: ORAL_TABLET | ORAL | 1 refills | Status: DC

## 2018-10-09 MED ORDER — LIPITOR 20 MG PO TABS: ORAL_TABLET | ORAL | 1 refills | Status: DC

## 2018-10-09 MED ORDER — GLIMEPIRIDE 1 MG PO TABS: 1.0000 mg | ORAL_TABLET | Freq: Every day | ORAL | 1 refills | Status: DC

## 2018-10-09 MED ORDER — METFORMIN HCL 1000 MG PO TABS: 1000.0000 mg | ORAL_TABLET | Freq: Two times a day (BID) | ORAL | 1 refills | Status: DC

## 2018-10-09 MED ORDER — LISINOPRIL 10 MG PO TABS: 10.0000 mg | ORAL_TABLET | Freq: Every day | ORAL | 1 refills | Status: DC

## 2018-10-09 NOTE — Progress Notes (Signed)
[facility] [address] [place], [place] [address]  Internal MEDICINE  Telephone Visit  Patient Name: Makayla OvensSusan M Giovanelli  [id number]  [id number]  Date of Service: 10/09/2018  I connected with the patient at 9:52am by webcam and verified the patients identity using two identifiers.   I discussed the limitations, risks, security and privacy concerns of performing an evaluation and management service by webcam and the availability of in person appointments. I also discussed with the patient that there may be a patient responsible charge related to the service.  The patient expressed understanding and agrees to proceed.    Chief Complaint  Patient presents with  . Telephone Assessment  . Telephone Screen  . Hypertension  . Hyperlipidemia  . Diabetes  . Quality Metric Gaps    foot exam and A1c and AWV    The patient has been contacted via webcam for follow up visit due to concerns for spread of novel coronavirus.  The patient states that she is doing well. She recently had labs done. She does have vitamin d deficiency, otherwise, her labs were good. Reviewed results of carotid doppler done 02/2018. There is mild plaque on right side and no plaque on left. There is <50% stenosis, bilaterally. Another carotid doppler should be performed in 02/2019. She needs to have refills of routine medications.   Diabetes She presents for her follow-up diabetic visit. She has type 2 diabetes mellitus. No MedicAlert identification noted. Her disease course has been stable. There are no hypoglycemic associated symptoms. Pertinent negatives for hypoglycemia include no dizziness, headaches, nervousness/anxiousness or tremors. There are no diabetic associated symptoms. Pertinent negatives for diabetes include no chest pain, no fatigue, no polydipsia and no polyuria. There are no hypoglycemic complications. Symptoms are stable. Diabetic complications include PVD. Risk factors for coronary artery disease  include diabetes mellitus, dyslipidemia, hypertension, obesity, post-menopausal and sedentary lifestyle. Current diabetic treatment includes oral agent (dual therapy). She is compliant with treatment all of the time. Her weight is stable. She is following a generally healthy diet. Meal planning includes avoidance of concentrated sweets. She has not had a previous visit with a dietitian. She participates in exercise intermittently. There is no change in her home blood glucose trend. An ACE inhibitor/angiotensin II receptor blocker is being taken. She does not see a podiatrist.Eye exam is current.       Current Medication: Outpatient Encounter Medications as of 10/09/2018  Medication Sig  . albuterol (VENTOLIN HFA) 108 (90 Base) MCG/ACT inhaler Inhale 2 puffs into the lungs every 6 (six) hours as needed for wheezing or shortness of breath. Or cough, pleasse give ventolin.  Marland Kitchen. aspirin 81 MG chewable tablet Chew 81 mg by mouth daily.  . Glucose Blood (ONETOUCH ULTRA VI) by In Vitro route. Use as directed once daily diag e11.65  . [DISCONTINUED] carvedilol (COREG) 25 MG tablet TAKE 1 TABLET BY MOUTH TWICE A DAY  . [DISCONTINUED] glimepiride (AMARYL) 1 MG tablet Take 1 tablet (1 mg total) by mouth daily.  . [DISCONTINUED] glucose blood test strip 1 each by Other route as needed for other. Use as instructed  . [DISCONTINUED] LIPITOR 20 MG tablet TAKE 1 TABLET AT PM WITH SUPPER - NAME BRAND IS MEDICALLY NECESSARY  . [DISCONTINUED] lisinopril (PRINIVIL,ZESTRIL) 10 MG tablet Take 1 tablet (10 mg total) by mouth daily.  . [DISCONTINUED] metFORMIN (GLUCOPHAGE) 1000 MG tablet Take 1 tablet (1,000 mg total) by mouth 2 (two) times daily.  . carvedilol (COREG) 25 MG tablet TAKE 1  TABLET BY MOUTH TWICE A DAY  . glimepiride (AMARYL) 1 MG tablet Take 1 tablet (1 mg total) by mouth daily.  . Insulin Pen Needle (NOVOFINE PLUS) 32G X 4 MM MISC by Does not apply route.  Marland Kitchen LIPITOR 20 MG tablet TAKE 1 TABLET AT PM WITH  SUPPER - NAME BRAND IS MEDICALLY NECESSARY  . lisinopril (ZESTRIL) 10 MG tablet Take 1 tablet (10 mg total) by mouth daily.  . metFORMIN (GLUCOPHAGE) 1000 MG tablet Take 1 tablet (1,000 mg total) by mouth 2 (two) times daily.  . [DISCONTINUED] amoxicillin (AMOXIL) 875 MG tablet Take 1 tablet (875 mg total) by mouth 2 (two) times daily. (Patient not taking: Reported on 10/09/2018)   No facility-administered encounter medications on file as of 10/09/2018.     Surgical History: History reviewed. No pertinent surgical history.  Medical History: Past Medical History:  Diagnosis Date  . Cellulitis   . Diabetes mellitus without complication ([place])   . Hyperlipidemia   . Hypertension   . Sleep apnea     Family History: Family History  Problem Relation Age of Onset  . Breast cancer Neg Hx   . Osteoarthritis Neg Hx   . Hypertension Neg Hx   . Renal cancer Neg Hx   . Heart disease Neg Hx   . Diabetes Neg Hx   . Hyperlipidemia Neg Hx     Social History   Socioeconomic History  . Marital status: Married    Spouse name: Not on file  . Number of children: Not on file  . Years of education: Not on file  . Highest education level: Not on file  Occupational History  . Not on file  Social Needs  . Financial resource strain: Not on file  . Food insecurity    Worry: Not on file    Inability: Not on file  . Transportation needs    Medical: Not on file    Non-medical: Not on file  Tobacco Use  . Smoking status: Former Research scientist (life sciences)  . Smokeless tobacco: Never Used  Substance and Sexual Activity  . Alcohol use: Yes    Comment: many years ago socially  . Drug use: No  . Sexual activity: Not on file  Lifestyle  . Physical activity    Days per week: Not on file    Minutes per session: Not on file  . Stress: Not on file  Relationships  . Social Herbalist on phone: Not on file    Gets together: Not on file    Attends religious service: Not on file    Active member of club or  organization: Not on file    Attends meetings of clubs or organizations: Not on file    Relationship status: Not on file  . Intimate partner violence    Fear of current or ex partner: Not on file    Emotionally abused: Not on file    Physically abused: Not on file    Forced sexual activity: Not on file  Other Topics Concern  . Not on file  Social History Narrative  . Not on file      Review of Systems  Constitutional: Negative for chills, fatigue and unexpected weight change.       Weight gain since stay at home orders.   HENT: Negative for congestion, postnasal drip, rhinorrhea, sneezing and sore throat.   Respiratory: Negative for cough, chest tightness, shortness of breath and wheezing.   Cardiovascular: Negative for chest pain  and palpitations.  Gastrointestinal: Negative for abdominal pain, constipation, diarrhea, nausea and vomiting.  Endocrine: Negative for cold intolerance, heat intolerance, polydipsia and polyuria.  Musculoskeletal: Negative for arthralgias, back pain, joint swelling and neck pain.  Skin: Negative for rash.  Allergic/Immunologic: Negative for environmental allergies.  Neurological: Negative for dizziness, tremors, numbness and headaches.  Hematological: Negative for adenopathy. Does not bruise/bleed easily.  Psychiatric/Behavioral: Negative for behavioral problems (Depression), sleep disturbance and suicidal ideas. The patient is not nervous/anxious.     Today's Vitals   10/09/18 0922  Weight: (!) 319 lb (144.7 kg)  Height: 6' (1.829 m)   Body mass index is 43.26 kg/m.  Observation/Objective:   The patient is alert and oriented. She is pleasant and answers all questions appropriately. Breathing is non-labored. She is in no acute distress at this time.    Assessment/Plan: 1. Uncontrolled type 2 diabetes mellitus with hyperglycemia (HCC) Stable. Continue diabetic medication as prescribed. Check HgbA1c at next, in-office visit.  - glimepiride  (AMARYL) 1 MG tablet; Take 1 tablet (1 mg total) by mouth daily.  Dispense: 90 tablet; Refill: 1 - metFORMIN (GLUCOPHAGE) 1000 MG tablet; Take 1 tablet (1,000 mg total) by mouth 2 (two) times daily.  Dispense: 180 tablet; Refill: 1  2. Essential hypertension Stable. Continue bp medication as prescribed  - carvedilol (COREG) 25 MG tablet; TAKE 1 TABLET BY MOUTH TWICE A DAY  Dispense: 180 tablet; Refill: 1 - lisinopril (ZESTRIL) 10 MG tablet; Take 1 tablet (10 mg total) by mouth daily.  Dispense: 90 tablet; Refill: 1  3. Mixed hyperlipidemia Recent lipid panel good. Continue atorvastatin as prescribed  - LIPITOR 20 MG tablet; TAKE 1 TABLET AT PM WITH SUPPER - NAME BRAND IS MEDICALLY NECESSARY  Dispense: 90 tablet; Refill: 1  4. Bilateral carotid artery occlusion Reviewed recent carotid doppler study. < 50% stenosis bilaterally with mild plaque on the right and none on the left.   5. Vitamin D deficiency OTC vitamin D3 5000 iu daily.   General Counseling: Thermon LeylandSusan verbalizes understanding of the findings of today's phone visit and agrees with plan of treatment. I have discussed any further diagnostic evaluation that may be needed or ordered today. We also reviewed her medications today. she has been encouraged to call the office with any questions or concerns that should arise related to todays visit.  Diabetes Counseling:  1. Addition of ACE inh/ ARB'S for nephroprotection. Microalbumin is updated  2. Diabetic foot care, prevention of complications. Podiatry consult 3. Exercise and lose weight.  4. Diabetic eye examination, Diabetic eye exam is updated  5. Monitor blood sugar closlely. nutrition counseling.  6. Sign and symptoms of hypoglycemia including shaking sweating,confusion and headaches.   This patient was seen by Vincent GrosHeather Janell Keeling FNP Collaboration with Dr Lyndon CodeFozia M Khan as a part of collaborative care agreement  Meds ordered this encounter  Medications  . carvedilol (COREG) 25 MG  tablet    Sig: TAKE 1 TABLET BY MOUTH TWICE A DAY    Dispense:  180 tablet    Refill:  1    Order Specific Question:   Supervising Provider    Answer:   Lyndon CodeKHAN, FOZIA M [[id number]]  . glimepiride (AMARYL) 1 MG tablet    Sig: Take 1 tablet (1 mg total) by mouth daily.    Dispense:  90 tablet    Refill:  1    Order Specific Question:   Supervising Provider    Answer:   Lyndon CodeKHAN, FOZIA M [[id number]]  . LIPITOR  20 MG tablet    Sig: TAKE 1 TABLET AT PM WITH SUPPER - NAME BRAND IS MEDICALLY NECESSARY    Dispense:  90 tablet    Refill:  1    Order Specific Question:   Supervising Provider    Answer:   Lyndon CodeKHAN, FOZIA M [[id number]]  . lisinopril (ZESTRIL) 10 MG tablet    Sig: Take 1 tablet (10 mg total) by mouth daily.    Dispense:  90 tablet    Refill:  1    Order Specific Question:   Supervising Provider    Answer:   Lyndon CodeKHAN, FOZIA M [[id number]]  . metFORMIN (GLUCOPHAGE) 1000 MG tablet    Sig: Take 1 tablet (1,000 mg total) by mouth 2 (two) times daily.    Dispense:  180 tablet    Refill:  1    PT NEEDS APPT.    Order Specific Question:   Supervising Provider    Answer:   Lyndon CodeKHAN, FOZIA M [[id number]]    Time spent: 20Minutes    Dr Lyndon CodeFozia M Khan Internal medicine

## 2018-10-13 ENCOUNTER — Other Ambulatory Visit: Payer: Self-pay | Admitting: Nurse Practitioner

## 2018-10-13 DIAGNOSIS — I10 Essential (primary) hypertension: Secondary | ICD-10-CM

## 2018-10-13 DIAGNOSIS — E1165 Type 2 diabetes mellitus with hyperglycemia: Secondary | ICD-10-CM

## 2018-10-13 MED ORDER — GLIMEPIRIDE 1 MG PO TABS: 1.0000 mg | ORAL_TABLET | Freq: Every day | ORAL | 1 refills | Status: DC

## 2018-10-13 MED ORDER — LISINOPRIL 10 MG PO TABS: 10.0000 mg | ORAL_TABLET | Freq: Every day | ORAL | 1 refills | Status: DC

## 2018-10-15 ENCOUNTER — Other Ambulatory Visit: Payer: Self-pay

## 2018-10-21 ENCOUNTER — Other Ambulatory Visit: Payer: Self-pay

## 2018-10-21 DIAGNOSIS — I10 Essential (primary) hypertension: Secondary | ICD-10-CM

## 2018-10-21 DIAGNOSIS — E782 Mixed hyperlipidemia: Secondary | ICD-10-CM

## 2018-10-21 DIAGNOSIS — E1165 Type 2 diabetes mellitus with hyperglycemia: Secondary | ICD-10-CM

## 2018-10-21 MED ORDER — CARVEDILOL 25 MG PO TABS: ORAL_TABLET | ORAL | 1 refills | Status: DC

## 2018-10-21 MED ORDER — ATORVASTATIN CALCIUM 20 MG PO TABS: ORAL_TABLET | ORAL | 1 refills | Status: DC

## 2018-10-21 MED ORDER — METFORMIN HCL 1000 MG PO TABS: 1000.0000 mg | ORAL_TABLET | Freq: Two times a day (BID) | ORAL | 1 refills | Status: DC

## 2018-10-21 NOTE — Telephone Encounter (Signed)
Pt called she want generic for lipitor not brand very expensive as per heather change to generic and also she take OTC COQ10

## 2019-01-15 ENCOUNTER — Other Ambulatory Visit: Payer: BC Managed Care – PPO | Admitting: Nurse Practitioner

## 2019-02-02 ENCOUNTER — Ambulatory Visit (INDEPENDENT_AMBULATORY_CARE_PROVIDER_SITE_OTHER): Payer: BC Managed Care – PPO | Admitting: Nurse Practitioner

## 2019-02-02 ENCOUNTER — Other Ambulatory Visit: Payer: Self-pay

## 2019-02-02 ENCOUNTER — Encounter: Payer: Self-pay | Admitting: Nurse Practitioner

## 2019-02-02 VITALS — BP 144/68 | HR 69 | Temp 97.3°F | Resp 16 | Ht 72.0 in | Wt 310.0 lb

## 2019-02-02 DIAGNOSIS — E1165 Type 2 diabetes mellitus with hyperglycemia: Secondary | ICD-10-CM

## 2019-02-02 DIAGNOSIS — I10 Essential (primary) hypertension: Secondary | ICD-10-CM

## 2019-02-02 DIAGNOSIS — Z0001 Encounter for general adult medical examination with abnormal findings: Secondary | ICD-10-CM | POA: Diagnosis not present

## 2019-02-02 DIAGNOSIS — L209 Atopic dermatitis, unspecified: Secondary | ICD-10-CM | POA: Diagnosis not present

## 2019-02-02 DIAGNOSIS — Z124 Encounter for screening for malignant neoplasm of cervix: Secondary | ICD-10-CM

## 2019-02-02 DIAGNOSIS — Z1231 Encounter for screening mammogram for malignant neoplasm of breast: Secondary | ICD-10-CM

## 2019-02-02 DIAGNOSIS — R300 Dysuria: Secondary | ICD-10-CM

## 2019-02-02 LAB — POCT GLYCOSYLATED HEMOGLOBIN (HGB A1C): Hemoglobin A1C: 7.4 % — AB (ref 4.0–5.6)

## 2019-02-02 MED ORDER — TRIAMCINOLONE ACETONIDE 0.025 % EX CREA: 1.0000 "application " | TOPICAL_CREAM | Freq: Two times a day (BID) | CUTANEOUS | 2 refills | Status: DC

## 2019-02-02 NOTE — Progress Notes (Signed)
[facility] [place], [place] [address]  Internal MEDICINE  Office Visit Note  Patient Name: Makayla Harrison  [id number]  [id number]  Date of Service: 02/18/2019   Pt is here for routine health maintenance examination   Chief Complaint  Patient presents with  . Annual Exam  . Gynecologic Exam  . Diabetes  . Hypertension  . Hyperlipidemia  . Quality Metric Gaps    pna vacc and colonoscopy   . Eczema    on scalp      The patient is here for health maintenance exam with pap smear. She is complaining of thinning hair with some dry and itching areas on the scalp. Blood pressure looks good. Blood sugars are slightly elevated. Overall, stable. Discussed importance of limiting carbohydrates and sugar in the diet and increasing exercise. She has lost nearly 20 pounds since her last visit. She does have rash under her arms. Did get new deodorant by accident and knows this is the cause. She is due for screening mammogram. Had labs done in 09/2018 and is vitamin d deficient. Is taking 1000iu of Vitamin d3 every day. She is due fr screening colonoscopy. Would like to do cologuard for colorectal cancer screen. Will contact her insurance to see if this is covered now.   Current Medication: Outpatient Encounter Medications as of 02/02/2019  Medication Sig  . albuterol (VENTOLIN HFA) 108 (90 Base) MCG/ACT inhaler Inhale 2 puffs into the lungs every 6 (six) hours as needed for wheezing or shortness of breath. Or cough, pleasse give ventolin.  Marland Kitchen aspirin 81 MG chewable tablet Chew 81 mg by mouth daily.  Marland Kitchen atorvastatin (LIPITOR) 20 MG tablet TAKE 1 TABLET AT PM WITH SUPPER -  . carvedilol (COREG) 25 MG tablet TAKE 1 TABLET BY MOUTH TWICE A DAY  . glimepiride (AMARYL) 1 MG tablet Take 1 tablet (1 mg total) by mouth daily.  . Glucose Blood (ONETOUCH ULTRA VI) by In Vitro route. Use as directed once daily diag e11.65  . Insulin Pen Needle (NOVOFINE PLUS) 32G X 4 MM MISC by Does  not apply route.  Marland Kitchen lisinopril (ZESTRIL) 10 MG tablet Take 1 tablet (10 mg total) by mouth daily.  . metFORMIN (GLUCOPHAGE) 1000 MG tablet Take 1 tablet (1,000 mg total) by mouth 2 (two) times daily.  Marland Kitchen triamcinolone (KENALOG) 0.025 % cream Apply 1 application topically 2 (two) times daily.   No facility-administered encounter medications on file as of 02/02/2019.     Surgical History: History reviewed. No pertinent surgical history.  Medical History: Past Medical History:  Diagnosis Date  . Cellulitis   . Diabetes mellitus without complication ([place])   . Hyperlipidemia   . Hypertension   . Sleep apnea     Family History: Family History  Problem Relation Age of Onset  . Breast cancer Neg Hx   . Osteoarthritis Neg Hx   . Hypertension Neg Hx   . Renal cancer Neg Hx   . Heart disease Neg Hx   . Diabetes Neg Hx   . Hyperlipidemia Neg Hx       Review of Systems  Constitutional: Negative for chills, fatigue and unexpected weight change.       Weight gain since stay at home orders.   HENT: Negative for congestion, postnasal drip, rhinorrhea, sneezing and sore throat.   Respiratory: Negative for cough, chest tightness, shortness of breath and wheezing.   Cardiovascular: Negative for chest pain and palpitations.  Gastrointestinal: Negative for abdominal pain,  constipation, diarrhea, nausea and vomiting.  Endocrine: Negative for cold intolerance, heat intolerance, polydipsia and polyuria.  Musculoskeletal: Negative for arthralgias, back pain, joint swelling and neck pain.  Skin: Negative for rash.  Allergic/Immunologic: Negative for environmental allergies.  Neurological: Negative for dizziness, tremors, numbness and headaches.  Hematological: Negative for adenopathy. Does not bruise/bleed easily.  Psychiatric/Behavioral: Negative for behavioral problems (Depression), sleep disturbance and suicidal ideas. The patient is not nervous/anxious.     Today's Vitals   02/02/19 0938   BP: (!) 144/68  Pulse: 69  Resp: 16  Temp: (!) 97.3 F (36.3 C)  SpO2: 100%  Weight: (!) 310 lb (140.6 kg)  Height: 6' (1.829 m)   Body mass index is 42.04 kg/m.  Physical Exam Vitals signs and nursing note reviewed.  Constitutional:      General: She is not in acute distress.    Appearance: Normal appearance. She is well-developed. She is obese. She is not diaphoretic.  HENT:     Head: Normocephalic and atraumatic.     Nose: Nose normal.     Mouth/Throat:     Pharynx: No oropharyngeal exudate.  Eyes:     Conjunctiva/sclera: Conjunctivae normal.     Pupils: Pupils are equal, round, and reactive to light.  Neck:     Musculoskeletal: Normal range of motion and neck supple.     Thyroid: No thyromegaly.     Vascular: No carotid bruit or JVD.     Trachea: No tracheal deviation.  Cardiovascular:     Rate and Rhythm: Normal rate and regular rhythm.     Pulses:          Dorsalis pedis pulses are 1+ on the right side and 1+ on the left side.       Posterior tibial pulses are 1+ on the right side and 1+ on the left side.     Heart sounds: Normal heart sounds. No murmur. No friction rub. No gallop.   Pulmonary:     Effort: Pulmonary effort is normal. No respiratory distress.     Breath sounds: Normal breath sounds. No wheezing or rales.  Chest:     Chest wall: No tenderness.     Breasts:        Right: Normal. No swelling, bleeding, inverted nipple, mass, nipple discharge, skin change or tenderness.        Left: Normal. No swelling, bleeding, inverted nipple, mass, nipple discharge, skin change or tenderness.  Abdominal:     General: Bowel sounds are normal.     Palpations: Abdomen is soft.     Tenderness: There is no abdominal tenderness.  Genitourinary:    Labia:        Right: No tenderness or lesion.        Left: No tenderness or lesion.      Vagina: Normal. No vaginal discharge, erythema or tenderness.     Cervix: No cervical motion tenderness, discharge, friability or  lesion.     Uterus: Normal.      Adnexa: Right adnexa normal and left adnexa normal.     Comments: No tenderness, masses, or organomeglay present during bimanual exam . Musculoskeletal: Normal range of motion.     Right foot: Normal range of motion. No deformity.     Left foot: Deformity present.  Feet:     Right foot:     Protective Sensation: 10 sites tested. 10 sites sensed.     Skin integrity: Skin integrity normal.     Toenail Condition:  Right toenails are normal.     Left foot:     Protective Sensation: 10 sites tested.     Skin integrity: Skin integrity normal.     Toenail Condition: Left toenails are normal.     Comments: There is 1 to 2+ pittind edema in bilateral feet. Lymphadenopathy:     Cervical: No cervical adenopathy.     Lower Body: No right inguinal adenopathy. No left inguinal adenopathy.  Skin:    General: Skin is warm and dry.  Neurological:     Mental Status: She is alert and oriented to person, place, and time.     Cranial Nerves: No cranial nerve deficit.  Psychiatric:        Behavior: Behavior normal.        Thought Content: Thought content normal.        Judgment: Judgment normal.      LABS: Recent Results (from the past 2160 hour(s))  UA/M w/rflx Culture, Routine     Status: Abnormal   Collection Time: 02/02/19  9:40 AM   Specimen: Urine   URINE  Result Value Ref Range   Specific Gravity, UA 1.020 1.005 - 1.030   pH, UA 5.5 5.0 - 7.5   Color, UA Yellow Yellow   Appearance Ur Cloudy (A) Clear   Leukocytes,UA 2+ (A) Negative   Protein,UA Negative Negative/Trace   Glucose, UA Negative Negative   Ketones, UA Negative Negative   RBC, UA Negative Negative   Bilirubin, UA Negative Negative   Urobilinogen, Ur 0.2 0.2 - 1.0 mg/dL   Nitrite, UA Negative Negative   Microscopic Examination See below:     Comment: Microscopic was indicated and was performed.   Urinalysis Reflex Comment     Comment: This specimen has reflexed to a Urine Culture.   Microscopic Examination     Status: Abnormal   Collection Time: 02/02/19  9:40 AM   URINE  Result Value Ref Range   WBC, UA 11-30 (A) 0 - 5 /hpf   RBC 0-2 0 - 2 /hpf   Epithelial Cells (non renal) 0-10 0 - 10 /hpf   Casts None seen None seen /lpf   Mucus, UA Present Not Estab.   Bacteria, UA Few None seen/Few  Urine Culture, Reflex     Status: None   Collection Time: 02/02/19  9:40 AM   URINE  Result Value Ref Range   Urine Culture, Routine Final report    Organism ID, Bacteria Comment     Comment: Greater than 2 organisms recovered, none predominant. Please submit another sample if clinically indicated. Greater than 100,000 colony forming units per mL   Pap IG and HPV (high risk) DNA detection     Status: None   Collection Time: 02/02/19 10:00 AM  Result Value Ref Range   Interpretation NILM     Comment: NEGATIVE FOR INTRAEPITHELIAL LESION OR MALIGNANCY.   Category NIL     Comment: Negative for Intraepithelial Lesion   Adequacy ENDO     Comment: Satisfactory for evaluation. Endocervical and/or squamous metaplastic cells (endocervical component) are present.    Clinician Provided ICD10 Comment     Comment: Z12.4   Performed by: Comment     Comment: Adine Madura, Cytotechnologist (ASCP)   Note: Comment     Comment: The Pap smear is a screening test designed to aid in the detection of premalignant and malignant conditions of the uterine cervix.  It is not a diagnostic procedure and should not be used as  the sole means of detecting cervical cancer.  Both false-positive and false-negative reports do occur.    Test Methodology Comment     Comment: This liquid based ThinPrep(R) pap test was screened with the use of an image guided system.    HPV, high-risk Negative Negative    Comment: This nucleic acid amplification high-risk HPV test detects thirteen high-risk types (16,18,31,33,35,39,45,51,52,56,58,59,68) without differentiation.   POCT HgB A1C     Status: Abnormal    Collection Time: 02/02/19 10:32 AM  Result Value Ref Range   Hemoglobin A1C 7.4 (A) 4.0 - 5.6 %   HbA1c POC (<> result, manual entry)     HbA1c, POC (prediabetic range)     HbA1c, POC (controlled diabetic range)     Assessment/Plan: 1. Encounter for general adult medical examination with abnormal findings Annual health maintenance exam with pap smear today.   2. Type 2 diabetes mellitus with hyperglycemia, without long-term current use of insulin (HCC) - POCT HgB A1C 7.4 today. contiue diabetic medication as prescribed. Advised cutting back intake of carbohydrates and sweets and incorporation of exercise into daily routine.   3. Essential hypertension Stable. Continue bp medication as prescribed   4. Atopic dermatitis, unspecified type Add triamcinolone cream - apply to affected areas twice daily as needed for itching and rash.  - triamcinolone (KENALOG) 0.025 % cream; Apply 1 application topically 2 (two) times daily.  Dispense: 80 g; Refill: 2  5. Routine cervical smear - Pap IG and HPV (high risk) DNA detection  6. Encounter for screening mammogram for malignant neoplasm of breast - MM 3D SCREEN BREAST BILATERAL; Future  7. Dysuria - UA/M w/rflx Culture, Routine  General Counseling: kaelynne christley understanding of the findings of todays visit and agrees with plan of treatment. I have discussed any further diagnostic evaluation that may be needed or ordered today. We also reviewed her medications today. she has been encouraged to call the office with any questions or concerns that should arise related to todays visit.    Counseling:  Diabetes Counseling:  1. Addition of ACE inh/ ARB'S for nephroprotection. Microalbumin is updated  2. Diabetic foot care, prevention of complications. Podiatry consult 3. Exercise and lose weight.  4. Diabetic eye examination, Diabetic eye exam is updated  5. Monitor blood sugar closlely. nutrition counseling.  6. Sign and symptoms of  hypoglycemia including shaking sweating,confusion and headaches.  This patient was seen by Vincent Gros FNP Collaboration with Dr Lyndon Code as a part of collaborative care agreement  Orders Placed This Encounter  Procedures  . Microscopic Examination  . Urine Culture, Reflex  . MM 3D SCREEN BREAST BILATERAL  . UA/M w/rflx Culture, Routine  . POCT HgB A1C    Meds ordered this encounter  Medications  . triamcinolone (KENALOG) 0.025 % cream    Sig: Apply 1 application topically 2 (two) times daily.    Dispense:  80 g    Refill:  2    Order Specific Question:   Supervising Provider    Answer:   Lyndon Code [[id number]]    Time spent: 69 Minutes      Lyndon Code, MD  Internal Medicine

## 2019-02-03 NOTE — Progress Notes (Signed)
Waiting on results of culture and sensitivity.

## 2019-02-04 LAB — MICROSCOPIC EXAMINATION: Casts: NONE SEEN /lpf

## 2019-02-04 LAB — UA/M W/RFLX CULTURE, ROUTINE
Bilirubin, UA: NEGATIVE
Glucose, UA: NEGATIVE
Ketones, UA: NEGATIVE
Nitrite, UA: NEGATIVE
Protein,UA: NEGATIVE
RBC, UA: NEGATIVE
Specific Gravity, UA: 1.02 (ref 1.005–1.030)
Urobilinogen, Ur: 0.2 mg/dL (ref 0.2–1.0)
pH, UA: 5.5 (ref 5.0–7.5)

## 2019-02-04 LAB — URINE CULTURE, REFLEX

## 2019-02-08 LAB — PAP IG AND HPV HIGH-RISK: HPV, high-risk: NEGATIVE

## 2019-02-08 NOTE — Progress Notes (Signed)
Please let the patient know that her pap smear was normal. Thanks.

## 2019-02-08 NOTE — Progress Notes (Signed)
Suspect bacterial contamination of urine. Patient asymptomatic. No treatment needed at this time.

## 2019-02-09 ENCOUNTER — Telehealth: Payer: Self-pay

## 2019-02-09 NOTE — Telephone Encounter (Signed)
Per Makayla Harrison let the patient know that her pap smear was normal.

## 2019-02-18 DIAGNOSIS — L209 Atopic dermatitis, unspecified: Secondary | ICD-10-CM | POA: Insufficient documentation

## 2019-02-18 DIAGNOSIS — Z124 Encounter for screening for malignant neoplasm of cervix: Secondary | ICD-10-CM | POA: Insufficient documentation

## 2019-02-18 DIAGNOSIS — Z1231 Encounter for screening mammogram for malignant neoplasm of breast: Secondary | ICD-10-CM | POA: Insufficient documentation

## 2019-02-18 DIAGNOSIS — R300 Dysuria: Secondary | ICD-10-CM | POA: Insufficient documentation

## 2019-03-11 ENCOUNTER — Ambulatory Visit
Admission: RE | Admit: 2019-03-11 | Discharge: 2019-03-11 | Disposition: A | Discharge status: Home / Self Care | Payer: BC Managed Care – PPO | Source: Ambulatory Visit | Attending: Nurse Practitioner | Admitting: Nurse Practitioner

## 2019-03-11 DIAGNOSIS — Z1231 Encounter for screening mammogram for malignant neoplasm of breast: Secondary | ICD-10-CM | POA: Insufficient documentation

## 2019-03-12 NOTE — Progress Notes (Signed)
Negative mammogram

## 2019-04-13 ENCOUNTER — Other Ambulatory Visit: Payer: Self-pay

## 2019-04-13 DIAGNOSIS — I10 Essential (primary) hypertension: Secondary | ICD-10-CM

## 2019-04-13 DIAGNOSIS — E782 Mixed hyperlipidemia: Secondary | ICD-10-CM

## 2019-04-13 DIAGNOSIS — E1165 Type 2 diabetes mellitus with hyperglycemia: Secondary | ICD-10-CM

## 2019-04-13 MED ORDER — ATORVASTATIN CALCIUM 20 MG PO TABS: ORAL_TABLET | ORAL | 1 refills | Status: DC

## 2019-04-13 MED ORDER — GLIMEPIRIDE 1 MG PO TABS: 1.0000 mg | ORAL_TABLET | Freq: Every day | ORAL | 1 refills | Status: DC

## 2019-04-13 MED ORDER — LISINOPRIL 10 MG PO TABS: 10.0000 mg | ORAL_TABLET | Freq: Every day | ORAL | 1 refills | Status: DC

## 2019-05-21 DIAGNOSIS — B351 Tinea unguium: Secondary | ICD-10-CM | POA: Diagnosis not present

## 2019-05-21 DIAGNOSIS — L97511 Non-pressure chronic ulcer of other part of right foot limited to breakdown of skin: Secondary | ICD-10-CM | POA: Diagnosis not present

## 2019-05-21 DIAGNOSIS — M2041 Other hammer toe(s) (acquired), right foot: Secondary | ICD-10-CM | POA: Diagnosis not present

## 2019-05-21 DIAGNOSIS — E11621 Type 2 diabetes mellitus with foot ulcer: Secondary | ICD-10-CM | POA: Diagnosis not present

## 2019-06-10 ENCOUNTER — Telehealth: Payer: Self-pay

## 2019-06-10 ENCOUNTER — Other Ambulatory Visit: Payer: Self-pay

## 2019-06-10 DIAGNOSIS — E1165 Type 2 diabetes mellitus with hyperglycemia: Secondary | ICD-10-CM

## 2019-06-10 MED ORDER — METFORMIN HCL 1000 MG PO TABS: 1000.0000 mg | ORAL_TABLET | Freq: Two times a day (BID) | ORAL | 0 refills | Status: DC

## 2019-06-10 NOTE — Telephone Encounter (Signed)
CALLED PATIENT TO SCHEDULE A FOLLOW UP APPOINTMENT FOR FURTHER MEDICATION REFILLS AFTER SUPPLYING A 30 DAY SUPPLY OF METFORMIN. PT REFUSED TO SCHEDULE AT TIME I CALLED STATED SHE WOULD CALL BACK TO SCHEDULE.

## 2019-06-16 ENCOUNTER — Telehealth: Payer: Self-pay

## 2019-06-16 NOTE — Telephone Encounter (Signed)
CONFIRMED AND SCREENED FOR 06-18-19 OV. 

## 2019-06-18 ENCOUNTER — Other Ambulatory Visit: Payer: Self-pay

## 2019-06-18 ENCOUNTER — Encounter: Payer: Self-pay | Admitting: Nurse Practitioner

## 2019-06-18 ENCOUNTER — Ambulatory Visit: Payer: BC Managed Care – PPO | Admitting: Nurse Practitioner

## 2019-06-18 VITALS — BP 129/60 | HR 79 | Temp 97.8°F | Resp 16 | Ht 72.0 in | Wt 312.4 lb

## 2019-06-18 DIAGNOSIS — E782 Mixed hyperlipidemia: Secondary | ICD-10-CM

## 2019-06-18 DIAGNOSIS — E1165 Type 2 diabetes mellitus with hyperglycemia: Secondary | ICD-10-CM

## 2019-06-18 DIAGNOSIS — I10 Essential (primary) hypertension: Secondary | ICD-10-CM

## 2019-06-18 LAB — POCT GLYCOSYLATED HEMOGLOBIN (HGB A1C): Hemoglobin A1C: 8.7 % — AB (ref 4.0–5.6)

## 2019-06-18 MED ORDER — ATORVASTATIN CALCIUM 20 MG PO TABS: ORAL_TABLET | ORAL | 1 refills | Status: DC

## 2019-06-18 MED ORDER — CARVEDILOL 25 MG PO TABS: ORAL_TABLET | ORAL | 1 refills | Status: DC

## 2019-06-18 MED ORDER — METFORMIN HCL 1000 MG PO TABS: 1000.0000 mg | ORAL_TABLET | Freq: Two times a day (BID) | ORAL | 1 refills | Status: DC

## 2019-06-18 MED ORDER — STEGLATRO 5 MG PO TABS: 5.0000 mg | ORAL_TABLET | Freq: Every day | ORAL | 2 refills | Status: DC

## 2019-06-18 NOTE — Progress Notes (Signed)
[facility] [place], [place] [address]  Internal MEDICINE  Office Visit Note  Patient Name: Makayla Harrison  [id number]  [id number]  Date of Service: 06/21/2019  Chief Complaint  Patient presents with  . Diabetes  . Hyperlipidemia  . Hypertension    The patient is here for routine follow up. Blood sugars are elevated. Today, her HgbA1c is 8.7, up from 7.4 at the last check. Patient states that her diet choices have not been the best, recently. She is working two jobs and eating on the go recently. Eating foods which are fast and convenient. She states that when she checks her blood sugars, they are doing ok. Generally running in the low to mid 100s. She states that she is feeling well and has no signs or symptoms which are consistent with hyperglycemia. Blood pressure is well managed. Check of fasting, lipid panel in 09/2018, was normal.       Current Medication: Outpatient Encounter Medications as of 06/18/2019  Medication Sig Note  . albuterol (VENTOLIN HFA) 108 (90 Base) MCG/ACT inhaler Inhale 2 puffs into the lungs every 6 (six) hours as needed for wheezing or shortness of breath. Or cough, pleasse give ventolin.   Marland Kitchen aspirin 81 MG chewable tablet Chew 81 mg by mouth daily.   Marland Kitchen atorvastatin (LIPITOR) 20 MG tablet TAKE 1 TABLET AT PM WITH SUPPER -   . carvedilol (COREG) 25 MG tablet TAKE 1 TABLET BY MOUTH TWICE A DAY   . Glucose Blood (ONETOUCH ULTRA VI) by In Vitro route. Use as directed once daily diag e11.65   . Insulin Pen Needle (NOVOFINE PLUS) 32G X 4 MM MISC by Does not apply route.   Marland Kitchen lisinopril (ZESTRIL) 10 MG tablet Take 1 tablet (10 mg total) by mouth daily.   . metFORMIN (GLUCOPHAGE) 1000 MG tablet Take 1 tablet (1,000 mg total) by mouth 2 (two) times daily.   Marland Kitchen triamcinolone (KENALOG) 0.025 % cream Apply 1 application topically 2 (two) times daily.   . [DISCONTINUED] atorvastatin (LIPITOR) 20 MG tablet TAKE 1 TABLET AT PM WITH SUPPER -   .  [DISCONTINUED] carvedilol (COREG) 25 MG tablet TAKE 1 TABLET BY MOUTH TWICE A DAY   . [DISCONTINUED] glimepiride (AMARYL) 1 MG tablet Take 1 tablet (1 mg total) by mouth daily. 06/18/2019: change to steglatro  . [DISCONTINUED] metFORMIN (GLUCOPHAGE) 1000 MG tablet Take 1 tablet (1,000 mg total) by mouth 2 (two) times daily.   Marland Kitchen Ertugliflozin L-PyroglutamicAc (STEGLATRO) 5 MG TABS Take 5 mg by mouth daily before breakfast.    No facility-administered encounter medications on file as of 06/18/2019.    Surgical History: History reviewed. No pertinent surgical history.  Medical History: Past Medical History:  Diagnosis Date  . Cellulitis   . Diabetes mellitus without complication ([place])   . Hyperlipidemia   . Hypertension   . Sleep apnea     Family History: Family History  Problem Relation Age of Onset  . Breast cancer Neg Hx   . Osteoarthritis Neg Hx   . Hypertension Neg Hx   . Renal cancer Neg Hx   . Heart disease Neg Hx   . Diabetes Neg Hx   . Hyperlipidemia Neg Hx     Social History   Socioeconomic History  . Marital status: Married    Spouse name: Not on file  . Number of children: Not on file  . Years of education: Not on file  . Highest education level: Not on file  Occupational History  . Not on file  Tobacco Use  . Smoking status: Former Smoker    Types: Cigarettes  . Smokeless tobacco: Never Used  . Tobacco comment: early 20's  Substance and Sexual Activity  . Alcohol use: Yes    Comment:  socially  . Drug use: No  . Sexual activity: Not on file  Other Topics Concern  . Not on file  Social History Narrative  . Not on file   Social Determinants of Health   Financial Resource Strain:   . Difficulty of Paying Living Expenses:   Food Insecurity:   . Worried About Programme researcher, broadcasting/film/video in the Last Year:   . Barista in the Last Year:   Transportation Needs:   . Freight forwarder (Medical):   Marland Kitchen Lack of Transportation (Non-Medical):   Physical  Activity:   . Days of Exercise per Week:   . Minutes of Exercise per Session:   Stress:   . Feeling of Stress :   Social Connections:   . Frequency of Communication with Friends and Family:   . Frequency of Social Gatherings with Friends and Family:   . Attends Religious Services:   . Active Member of Clubs or Organizations:   . Attends Banker Meetings:   Marland Kitchen Marital Status:   Intimate Partner Violence:   . Fear of Current or Ex-Partner:   . Emotionally Abused:   Marland Kitchen Physically Abused:   . Sexually Abused:       Review of Systems  Constitutional: Negative for activity change, chills, fatigue and unexpected weight change.  HENT: Negative for congestion, postnasal drip, rhinorrhea, sneezing and sore throat.   Respiratory: Negative for cough, chest tightness, shortness of breath and wheezing.   Cardiovascular: Negative for chest pain and palpitations.  Gastrointestinal: Negative for abdominal pain, constipation, diarrhea, nausea and vomiting.  Endocrine: Negative for cold intolerance, heat intolerance, polydipsia and polyuria.       Blood sugars have been elevated recently.   Musculoskeletal: Negative for arthralgias, back pain, joint swelling and neck pain.  Skin: Negative for rash.  Allergic/Immunologic: Negative for environmental allergies.  Neurological: Negative for dizziness, tremors, numbness and headaches.  Hematological: Negative for adenopathy. Does not bruise/bleed easily.  Psychiatric/Behavioral: Negative for behavioral problems (Depression), sleep disturbance and suicidal ideas. The patient is not nervous/anxious.     Today's Vitals   06/18/19 1538  BP: 129/60  Pulse: 79  Resp: 16  Temp: 97.8 F (36.6 C)  SpO2: 97%  Weight: (!) 312 lb 6.4 oz (141.7 kg)  Height: 6' (1.829 m)   Body mass index is 42.37 kg/m.  Physical Exam Vitals and nursing note reviewed.  Constitutional:      General: She is not in acute distress.    Appearance: Normal  appearance. She is well-developed. She is obese. She is not diaphoretic.  HENT:     Head: Normocephalic and atraumatic.     Nose: Nose normal.     Mouth/Throat:     Pharynx: No oropharyngeal exudate.  Eyes:     Pupils: Pupils are equal, round, and reactive to light.  Neck:     Thyroid: No thyromegaly.     Vascular: No JVD.     Trachea: No tracheal deviation.  Cardiovascular:     Rate and Rhythm: Normal rate and regular rhythm.     Heart sounds: Normal heart sounds. No murmur. No friction rub. No gallop.   Pulmonary:     Effort:  Pulmonary effort is normal. No respiratory distress.     Breath sounds: Normal breath sounds. No wheezing or rales.  Chest:     Chest wall: No tenderness.  Abdominal:     Palpations: Abdomen is soft.  Musculoskeletal:        General: Normal range of motion.     Cervical back: Normal range of motion and neck supple.  Lymphadenopathy:     Cervical: No cervical adenopathy.  Skin:    General: Skin is warm and dry.  Neurological:     Mental Status: She is alert and oriented to person, place, and time.     Cranial Nerves: No cranial nerve deficit.  Psychiatric:        Mood and Affect: Mood normal.        Behavior: Behavior normal.        Thought Content: Thought content normal.        Judgment: Judgment normal.    Assessment/Plan:  1. Type 2 diabetes mellitus with hyperglycemia, without long-term current use of insulin (HCC) - POCT HgB A1C 8.7 today. Start steglatro 5mg  daily. Continue metformin as previously prescribed. Advised she limit intake of carbohydrates and intake and incorporate exercise into her daily routine.  - Ertugliflozin L-PyroglutamicAc (STEGLATRO) 5 MG TABS; Take 5 mg by mouth daily before breakfast.  Dispense: 30 tablet; Refill: 2 - metFORMIN (GLUCOPHAGE) 1000 MG tablet; Take 1 tablet (1,000 mg total) by mouth 2 (two) times daily.  Dispense: 180 tablet; Refill: 1  2. Essential hypertension Stable. Continue bp medications as  prescribed - carvedilol (COREG) 25 MG tablet; TAKE 1 TABLET BY MOUTH TWICE A DAY  Dispense: 180 tablet; Refill: 1  3. Mixed hyperlipidemia Most recent fasting lipid panel normal. Continue atorvastatin as prescribed  - atorvastatin (LIPITOR) 20 MG tablet; TAKE 1 TABLET AT PM WITH SUPPER -  Dispense: 90 tablet; Refill: 1  4. Morbid (severe) obesity due to excess calories [facility]) Discussed importance of controlling calorie intake and increasing exercise to help with weight loss. Will help to control diabetes, hypertension, and to reduce risk of coronary artery disease.    General Counseling: [email or web address] juste understanding of the findings of todays visit and agrees with plan of treatment. I have discussed any further diagnostic evaluation that may be needed or ordered today. We also reviewed her medications today. she has been encouraged to call the office with any questions or concerns that should arise related to todays visit.  Diabetes Counseling:  1. Addition of ACE inh/ ARB'S for nephroprotection. Microalbumin is updated  2. Diabetic foot care, prevention of complications. Podiatry consult 3. Exercise and lose weight.  4. Diabetic eye examination, Diabetic eye exam is updated  5. Monitor blood sugar closlely. nutrition counseling.  6. Sign and symptoms of hypoglycemia including shaking sweating,confusion and headaches.  This patient was seen by Thermon Leyland FNP Collaboration with Dr Vincent Gros as a part of collaborative care agreement  Orders Placed This Encounter  Procedures  . POCT HgB A1C    Meds ordered this encounter  Medications  . atorvastatin (LIPITOR) 20 MG tablet    Sig: TAKE 1 TABLET AT PM WITH SUPPER -    Dispense:  90 tablet    Refill:  1    Order Specific Question:   Supervising Provider    Answer:   Lyndon Code [[id number]]  . carvedilol (COREG) 25 MG tablet    Sig: TAKE 1 TABLET BY MOUTH TWICE A DAY    Dispense:  180 tablet    Refill:  1    Order Specific  Question:   Supervising Provider    Answer:   Lyndon Code [[id number]]  . metFORMIN (GLUCOPHAGE) 1000 MG tablet    Sig: Take 1 tablet (1,000 mg total) by mouth 2 (two) times daily.    Dispense:  180 tablet    Refill:  1    PT NEEDS APPT for refills    Order Specific Question:   Supervising Provider    Answer:   Lyndon Code [[id number]]  . Ertugliflozin L-PyroglutamicAc (STEGLATRO) 5 MG TABS    Sig: Take 5 mg by mouth daily before breakfast.    Dispense:  30 tablet    Refill:  2    Samples provided today    Order Specific Question:   Supervising Provider    Answer:   Lyndon Code [[id number]]    Total time spent: 30 Minutes    Time spent includes review of chart, medications, test results, and follow up plan with the patient.      Dr Lyndon Code Internal medicine

## 2019-08-13 DIAGNOSIS — M79672 Pain in left foot: Secondary | ICD-10-CM | POA: Diagnosis not present

## 2019-08-13 DIAGNOSIS — M7672 Peroneal tendinitis, left leg: Secondary | ICD-10-CM | POA: Diagnosis not present

## 2019-09-22 ENCOUNTER — Ambulatory Visit: Payer: BC Managed Care – PPO | Admitting: Nurse Practitioner

## 2019-10-19 ENCOUNTER — Other Ambulatory Visit: Payer: Self-pay

## 2019-10-20 ENCOUNTER — Other Ambulatory Visit: Payer: Self-pay

## 2019-10-22 ENCOUNTER — Telehealth: Payer: Self-pay

## 2019-10-22 NOTE — Telephone Encounter (Signed)
Confirmed and screened for 10-27-19 ov. 

## 2019-10-27 ENCOUNTER — Other Ambulatory Visit: Payer: Self-pay

## 2019-10-27 ENCOUNTER — Ambulatory Visit: Payer: BC Managed Care – PPO | Admitting: Nurse Practitioner

## 2019-10-27 ENCOUNTER — Encounter: Payer: Self-pay | Admitting: Nurse Practitioner

## 2019-10-27 VITALS — BP 156/69 | HR 71 | Temp 97.2°F | Resp 16 | Ht 72.0 in | Wt 314.6 lb

## 2019-10-27 DIAGNOSIS — I10 Essential (primary) hypertension: Secondary | ICD-10-CM

## 2019-10-27 DIAGNOSIS — L209 Atopic dermatitis, unspecified: Secondary | ICD-10-CM | POA: Diagnosis not present

## 2019-10-27 DIAGNOSIS — E1165 Type 2 diabetes mellitus with hyperglycemia: Secondary | ICD-10-CM | POA: Diagnosis not present

## 2019-10-27 DIAGNOSIS — E782 Mixed hyperlipidemia: Secondary | ICD-10-CM

## 2019-10-27 LAB — POCT GLYCOSYLATED HEMOGLOBIN (HGB A1C): Hemoglobin A1C: 8.3 % — AB (ref 4.0–5.6)

## 2019-10-27 MED ORDER — METFORMIN HCL 1000 MG PO TABS: 1000.0000 mg | ORAL_TABLET | Freq: Two times a day (BID) | ORAL | 3 refills | Status: AC

## 2019-10-27 MED ORDER — TRULICITY 0.75 MG/0.5ML ~~LOC~~ SOAJ: 0.7500 mg | SUBCUTANEOUS | 5 refills | Status: AC

## 2019-10-27 MED ORDER — ATORVASTATIN CALCIUM 20 MG PO TABS: ORAL_TABLET | ORAL | 3 refills | Status: AC

## 2019-10-27 MED ORDER — CARVEDILOL 25 MG PO TABS: ORAL_TABLET | ORAL | 3 refills | Status: AC

## 2019-10-27 MED ORDER — TRIAMCINOLONE ACETONIDE 0.025 % EX CREA: 1.0000 "application " | TOPICAL_CREAM | Freq: Two times a day (BID) | CUTANEOUS | 3 refills | Status: AC

## 2019-10-27 MED ORDER — LISINOPRIL 10 MG PO TABS: 10.0000 mg | ORAL_TABLET | Freq: Every day | ORAL | 3 refills | Status: AC

## 2019-10-27 NOTE — Progress Notes (Signed)
[facility] [address] [place], [place] [address]  Internal MEDICINE  Office Visit Note  Patient Name: Makayla Harrison  [id number]  [id number]  Date of Service: 11/22/2019  Chief Complaint  Patient presents with  . Diabetes    3 month f-up    The patient is here for follow up. Blood pressure is elevated. States that she just sold her home. She will be moving to [place] in the middle of august. Blood sugars are still a little elevated. Her HgbA1c is 8.3 today, down from 8.7 at her last visit. She did trial of steglatro 5mg . States that she took this medication for a few weeks. Did nothing to improve her blood sugars. She did stop taking it. She did well on trulicity in the past. She stopped taking it because it was no longer covered by her insurance. Looks as though coverage has changed and improved for this medication. She states that she still has to get labs checked. Plans to get this done prior to moving. Will contact her with results when they are available.       Current Medication: Outpatient Encounter Medications as of 10/27/2019  Medication Sig  . albuterol (VENTOLIN HFA) 108 (90 Base) MCG/ACT inhaler Inhale 2 puffs into the lungs every 6 (six) hours as needed for wheezing or shortness of breath. Or cough, pleasse give ventolin.  10/29/2019 aspirin 81 MG chewable tablet Chew 81 mg by mouth daily.  Marland Kitchen atorvastatin (LIPITOR) 20 MG tablet TAKE 1 TABLET AT PM WITH SUPPER -  . carvedilol (COREG) 25 MG tablet TAKE 1 TABLET BY MOUTH TWICE A DAY  . Glucose Blood (ONETOUCH ULTRA VI) by In Vitro route. Use as directed once daily diag e11.65  . Insulin Pen Needle (NOVOFINE PLUS) 32G X 4 MM MISC by Does not apply route.  Marland Kitchen lisinopril (ZESTRIL) 10 MG tablet Take 1 tablet (10 mg total) by mouth daily.  . metFORMIN (GLUCOPHAGE) 1000 MG tablet Take 1 tablet (1,000 mg total) by mouth 2 (two) times daily.  Marland Kitchen triamcinolone (KENALOG) 0.025 % cream Apply 1 application topically 2 (two) times daily.   . [DISCONTINUED] atorvastatin (LIPITOR) 20 MG tablet TAKE 1 TABLET AT PM WITH SUPPER -  . [DISCONTINUED] carvedilol (COREG) 25 MG tablet TAKE 1 TABLET BY MOUTH TWICE A DAY  . [DISCONTINUED] Ertugliflozin L-PyroglutamicAc (STEGLATRO) 5 MG TABS Take 5 mg by mouth daily before breakfast.  . [DISCONTINUED] lisinopril (ZESTRIL) 10 MG tablet Take 1 tablet (10 mg total) by mouth daily.  . [DISCONTINUED] metFORMIN (GLUCOPHAGE) 1000 MG tablet Take 1 tablet (1,000 mg total) by mouth 2 (two) times daily.  . [DISCONTINUED] triamcinolone (KENALOG) 0.025 % cream Apply 1 application topically 2 (two) times daily.  . Dulaglutide (TRULICITY) 0.75 MG/0.5ML SOPN Inject 0.5 mLs (0.75 mg total) into the skin once a week.   No facility-administered encounter medications on file as of 10/27/2019.    Surgical History: History reviewed. No pertinent surgical history.  Medical History: Past Medical History:  Diagnosis Date  . Cellulitis   . Diabetes mellitus without complication (HCC)   . Hyperlipidemia   . Hypertension   . Sleep apnea     Family History: Family History  Problem Relation Age of Onset  . Breast cancer Neg Hx   . Osteoarthritis Neg Hx   . Hypertension Neg Hx   . Renal cancer Neg Hx   . Heart disease Neg Hx   . Diabetes Neg Hx   . Hyperlipidemia Neg Hx  Social History   Socioeconomic History  . Marital status: Married    Spouse name: Not on file  . Number of children: Not on file  . Years of education: Not on file  . Highest education level: Not on file  Occupational History  . Not on file  Tobacco Use  . Smoking status: Former Smoker    Types: Cigarettes  . Smokeless tobacco: Never Used  . Tobacco comment: early 20's  Substance and Sexual Activity  . Alcohol use: Yes    Comment:  socially  . Drug use: No  . Sexual activity: Not on file  Other Topics Concern  . Not on file  Social History Narrative  . Not on file   Social Determinants of Health   Financial  Resource Strain:   . Difficulty of Paying Living Expenses:   Food Insecurity:   . Worried About Programme researcher, broadcasting/film/videounning Out of Food in the Last Year:   . Baristaan Out of Food in the Last Year:   Transportation Needs:   . Freight forwarderLack of Transportation (Medical):   Marland Kitchen. Lack of Transportation (Non-Medical):   Physical Activity:   . Days of Exercise per Week:   . Minutes of Exercise per Session:   Stress:   . Feeling of Stress :   Social Connections:   . Frequency of Communication with Friends and Family:   . Frequency of Social Gatherings with Friends and Family:   . Attends Religious Services:   . Active Member of Clubs or Organizations:   . Attends BankerClub or Organization Meetings:   Marland Kitchen. Marital Status:   Intimate Partner Violence:   . Fear of Current or Ex-Partner:   . Emotionally Abused:   Marland Kitchen. Physically Abused:   . Sexually Abused:       Review of Systems  Constitutional: Negative for activity change, chills, fatigue and unexpected weight change.  HENT: Negative for congestion, postnasal drip, rhinorrhea, sneezing and sore throat.   Respiratory: Negative for cough, chest tightness, shortness of breath and wheezing.   Cardiovascular: Negative for chest pain and palpitations.  Gastrointestinal: Negative for abdominal pain, constipation, diarrhea, nausea and vomiting.  Endocrine: Negative for cold intolerance, heat intolerance, polydipsia and polyuria.       Blood sugars improved but still elevated.   Musculoskeletal: Negative for arthralgias, back pain, joint swelling and neck pain.  Skin: Negative for rash.  Allergic/Immunologic: Negative for environmental allergies.  Neurological: Negative for dizziness, tremors, numbness and headaches.  Hematological: Negative for adenopathy. Does not bruise/bleed easily.  Psychiatric/Behavioral: Negative for behavioral problems (Depression), sleep disturbance and suicidal ideas. The patient is not nervous/anxious.     Today's Vitals   10/27/19 0903  BP: (!) 156/69   Pulse: 71  Resp: 16  Temp: (!) 97.2 F (36.2 C)  SpO2: 99%  Weight: (!) 314 lb 9.6 oz (142.7 kg)  Height: 6' (1.829 m)   Body mass index is 42.67 kg/m.  Physical Exam Vitals and nursing note reviewed.  Constitutional:      General: She is not in acute distress.    Appearance: Normal appearance. She is well-developed. She is obese. She is not diaphoretic.  HENT:     Head: Normocephalic and atraumatic.     Nose: Nose normal.     Mouth/Throat:     Pharynx: No oropharyngeal exudate.  Eyes:     Pupils: Pupils are equal, round, and reactive to light.  Neck:     Thyroid: No thyromegaly.     Vascular: No carotid  bruit or JVD.     Trachea: No tracheal deviation.  Cardiovascular:     Rate and Rhythm: Normal rate and regular rhythm.     Heart sounds: Normal heart sounds. No murmur heard.  No friction rub. No gallop.   Pulmonary:     Effort: Pulmonary effort is normal. No respiratory distress.     Breath sounds: Normal breath sounds. No wheezing or rales.  Chest:     Chest wall: No tenderness.  Abdominal:     Palpations: Abdomen is soft.  Musculoskeletal:        General: Normal range of motion.     Cervical back: Normal range of motion and neck supple.  Lymphadenopathy:     Cervical: No cervical adenopathy.  Skin:    General: Skin is warm and dry.  Neurological:     Mental Status: She is alert and oriented to person, place, and time.     Cranial Nerves: No cranial nerve deficit.  Psychiatric:        Mood and Affect: Mood normal.        Behavior: Behavior normal.        Thought Content: Thought content normal.        Judgment: Judgment normal.    Assessment/Plan: 1. Type 2 diabetes mellitus with hyperglycemia, without long-term current use of insulin (HCC) - POCT HgB A1C 8.3 today. Restart trulicity 0.75mg  weekly. Continue metformin as prescribed. She should monitor blood sugars closely.  - Dulaglutide (TRULICITY) 0.75 MG/0.5ML SOPN; Inject 0.5 mLs (0.75 mg total) into  the skin once a week.  Dispense: 4 pen; Refill: 5 - metFORMIN (GLUCOPHAGE) 1000 MG tablet; Take 1 tablet (1,000 mg total) by mouth 2 (two) times daily.  Dispense: 180 tablet; Refill: 3  2. Essential hypertension Blood pressure generally stable. Continue coreg 25mg  twice dialy. Take lisinopril 10mg  daily. Limit salt and increase water intake in the diet. Monitor blood pressure closely.  - carvedilol (COREG) 25 MG tablet; TAKE 1 TABLET BY MOUTH TWICE A DAY  Dispense: 180 tablet; Refill: 3 - lisinopril (ZESTRIL) 10 MG tablet; Take 1 tablet (10 mg total) by mouth daily.  Dispense: 90 tablet; Refill: 3  3. Mixed hyperlipidemia Check fasting lipid panel and adjust atorvastatin dose as indicated.  - atorvastatin (LIPITOR) 20 MG tablet; TAKE 1 TABLET AT PM WITH SUPPER -  Dispense: 90 tablet; Refill: 3  4. Atopic dermatitis, unspecified type Use triamcinolone 0.025% cream on effected areas twice daily as needed.  - triamcinolone (KENALOG) 0.025 % cream; Apply 1 application topically 2 (two) times daily.  Dispense: 80 g; Refill: 3   General Counseling: dola lunsford understanding of the findings of todays visit and agrees with plan of treatment. I have discussed any further diagnostic evaluation that may be needed or ordered today. We also reviewed her medications today. she has been encouraged to call the office with any questions or concerns that should arise related to todays visit.  Diabetes Counseling:  1. Addition of ACE inh/ ARB'S for nephroprotection. Microalbumin is updated  2. Diabetic foot care, prevention of complications. Podiatry consult 3. Exercise and lose weight.  4. Diabetic eye examination, Diabetic eye exam is updated  5. Monitor blood sugar closlely. nutrition counseling.  6. Sign and symptoms of hypoglycemia including shaking sweating,confusion and headaches.   This patient was seen by FNP Collaboration with Dr Thermon Leyland as a part of collaborative care  agreement  Orders Placed This Encounter  Procedures  . POCT HgB  A1C    Meds ordered this encounter  Medications  . atorvastatin (LIPITOR) 20 MG tablet    Sig: TAKE 1 TABLET AT PM WITH SUPPER -    Dispense:  90 tablet    Refill:  3    Order Specific Question:   Supervising Provider    Answer:   Lyndon Code [[id number]]  . carvedilol (COREG) 25 MG tablet    Sig: TAKE 1 TABLET BY MOUTH TWICE A DAY    Dispense:  180 tablet    Refill:  3    Order Specific Question:   Supervising Provider    Answer:   Lyndon Code [[id number]]  . lisinopril (ZESTRIL) 10 MG tablet    Sig: Take 1 tablet (10 mg total) by mouth daily.    Dispense:  90 tablet    Refill:  3    Order Specific Question:   Supervising Provider    Answer:   Lyndon Code [[id number]]  . metFORMIN (GLUCOPHAGE) 1000 MG tablet    Sig: Take 1 tablet (1,000 mg total) by mouth 2 (two) times daily.    Dispense:  180 tablet    Refill:  3    PT NEEDS APPT for refills    Order Specific Question:   Supervising Provider    Answer:   Lyndon Code [[id number]]  . triamcinolone (KENALOG) 0.025 % cream    Sig: Apply 1 application topically 2 (two) times daily.    Dispense:  80 g    Refill:  3    Order Specific Question:   Supervising Provider    Answer:   Lyndon Code [[id number]]  . Dulaglutide (TRULICITY) 0.75 MG/0.5ML SOPN    Sig: Inject 0.5 mLs (0.75 mg total) into the skin once a week.    Dispense:  4 pen    Refill:  5    Order Specific Question:   Supervising Provider    Answer:   Lyndon Code [[id number]]    Total time spent: 30 Minutes   Time spent includes review of chart, medications, test results, and follow up plan with the patient.      Dr Lyndon Code Internal medicine

## 2019-10-30 ENCOUNTER — Other Ambulatory Visit: Payer: Self-pay | Admitting: Nurse Practitioner

## 2019-10-30 DIAGNOSIS — Z0001 Encounter for general adult medical examination with abnormal findings: Secondary | ICD-10-CM | POA: Diagnosis not present

## 2019-10-30 DIAGNOSIS — I10 Essential (primary) hypertension: Secondary | ICD-10-CM | POA: Diagnosis not present

## 2019-10-30 DIAGNOSIS — E1165 Type 2 diabetes mellitus with hyperglycemia: Secondary | ICD-10-CM | POA: Diagnosis not present

## 2019-10-30 DIAGNOSIS — E559 Vitamin D deficiency, unspecified: Secondary | ICD-10-CM | POA: Diagnosis not present

## 2019-10-31 LAB — COMPREHENSIVE METABOLIC PANEL
ALT: 15 IU/L (ref 0–32)
AST: 10 IU/L (ref 0–40)
Albumin/Globulin Ratio: 1.8 (ref 1.2–2.2)
Albumin: 4.3 g/dL (ref 3.8–4.8)
Alkaline Phosphatase: 72 IU/L (ref 48–121)
BUN/Creatinine Ratio: 15 (ref 12–28)
BUN: 12 mg/dL (ref 8–27)
Bilirubin Total: 0.3 mg/dL (ref 0.0–1.2)
CO2: 22 mmol/L (ref 20–29)
Calcium: 9.3 mg/dL (ref 8.7–10.3)
Chloride: 101 mmol/L (ref 96–106)
Creatinine, Ser: 0.78 mg/dL (ref 0.57–1.00)
GFR calc Af Amer: 94 mL/min/{1.73_m2} (ref >59)
GFR calc non Af Amer: 82 mL/min/{1.73_m2} (ref >59)
Globulin, Total: 2.4 g/dL (ref 1.5–4.5)
Glucose: 195 mg/dL — ABNORMAL HIGH (ref 65–99)
Potassium: 4.9 mmol/L (ref 3.5–5.2)
Sodium: 138 mmol/L (ref 134–144)
Total Protein: 6.7 g/dL (ref 6.0–8.5)

## 2019-10-31 LAB — CBC
Hematocrit: 35.9 % (ref 34.0–46.6)
Hemoglobin: 11.8 g/dL (ref 11.1–15.9)
MCH: 30.1 pg (ref 26.6–33.0)
MCHC: 32.9 g/dL (ref 31.5–35.7)
MCV: 92 fL (ref 79–97)
Platelets: 293 10*3/uL (ref 150–450)
RBC: 3.92 x10E6/uL (ref 3.77–5.28)
RDW: 12.9 % (ref 11.7–15.4)
WBC: 6.8 10*3/uL (ref 3.4–10.8)

## 2019-10-31 LAB — LIPID PANEL W/O CHOL/HDL RATIO
Cholesterol, Total: 152 mg/dL (ref 100–199)
HDL: 47 mg/dL (ref >39)
LDL Chol Calc (NIH): 82 mg/dL (ref 0–99)
Triglycerides: 132 mg/dL (ref 0–149)
VLDL Cholesterol Cal: 23 mg/dL (ref 5–40)

## 2019-10-31 LAB — HCV AB W REFLEX TO QUANT PCR: HCV Ab: <0.1 s/co ratio (ref 0.0–0.9)

## 2019-10-31 LAB — TSH: TSH: 1.81 u[IU]/mL (ref 0.450–4.500)

## 2019-10-31 LAB — VITAMIN D 25 HYDROXY (VIT D DEFICIENCY, FRACTURES): Vit D, 25-Hydroxy: 24.9 ng/mL — ABNORMAL LOW (ref 30.0–100.0)

## 2019-10-31 LAB — T4, FREE: Free T4: 1.34 ng/dL (ref 0.82–1.77)

## 2019-10-31 LAB — HCV INTERPRETATION

## 2019-11-22 ENCOUNTER — Encounter: Payer: Self-pay | Admitting: Nurse Practitioner

## 2020-10-27 ENCOUNTER — Other Ambulatory Visit: Payer: Self-pay | Admitting: Nurse Practitioner

## 2020-10-27 DIAGNOSIS — I10 Essential (primary) hypertension: Secondary | ICD-10-CM

## 2020-12-13 ENCOUNTER — Other Ambulatory Visit: Payer: Self-pay | Admitting: Nurse Practitioner

## 2020-12-13 DIAGNOSIS — I10 Essential (primary) hypertension: Secondary | ICD-10-CM

## 2021-05-02 IMAGING — MG DIGITAL SCREENING BILAT W/ TOMO W/ CAD
8 of 14 series · 8 of 40 positions shown · non-contrast
Comparison: Previous exam(s).

ACR Breast Density Category a: The breast tissue is almost entirely
fatty.

CLINICAL DATA: Screening.

EXAM:
DIGITAL SCREENING BILATERAL MAMMOGRAM WITH TOMO AND CAD

[L MLO synth-2D (1 of 2)]
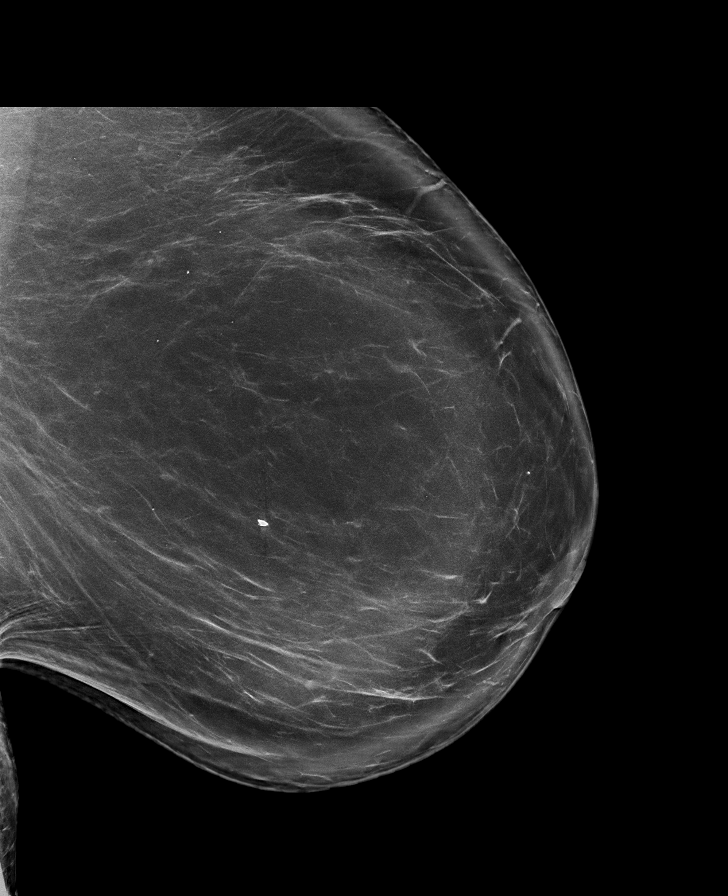

[R CC synth-2D (1 of 2)]
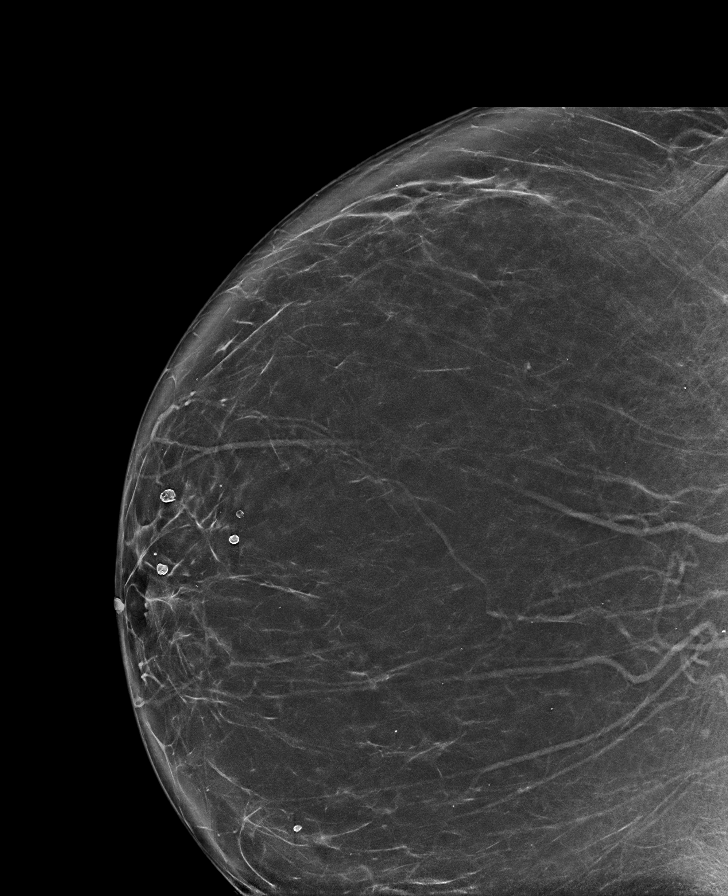

[R CC synth-2D (2 of 2)]
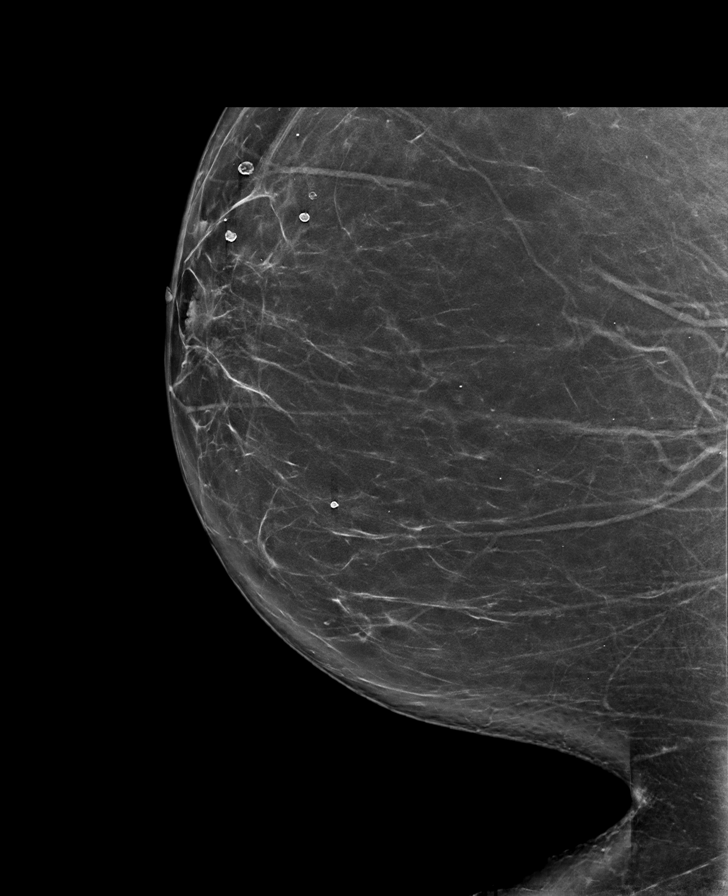

[R MLO synth-2D]
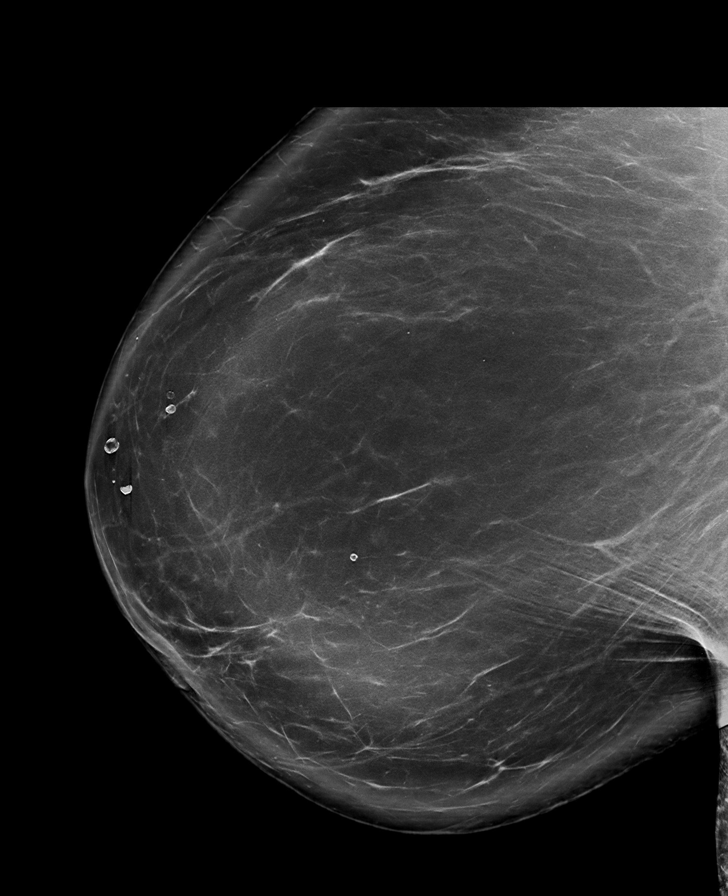

[L CC synth-2D (1 of 2)]
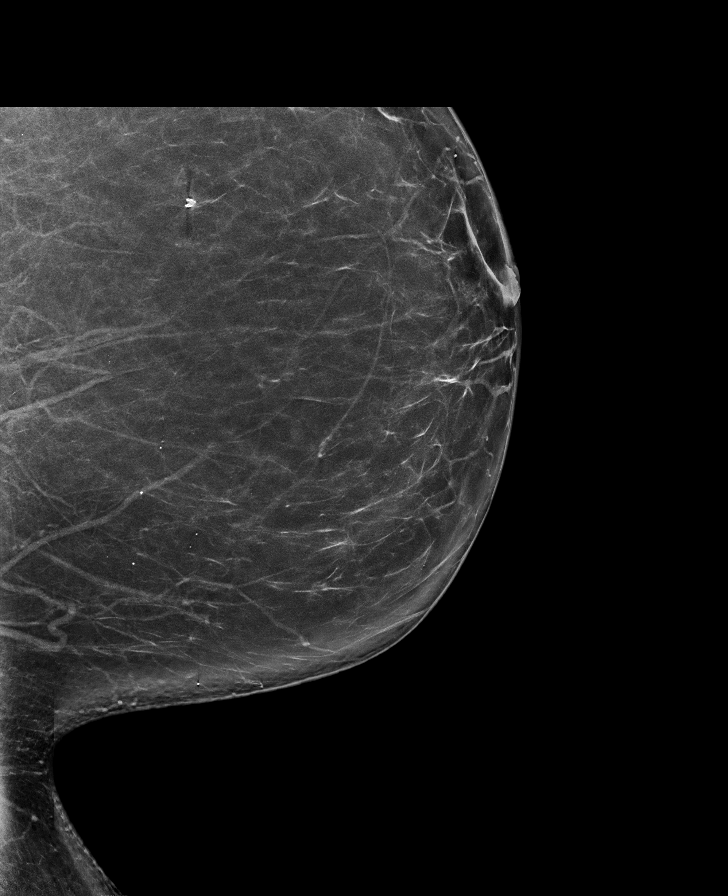

[L CC synth-2D (2 of 2)]
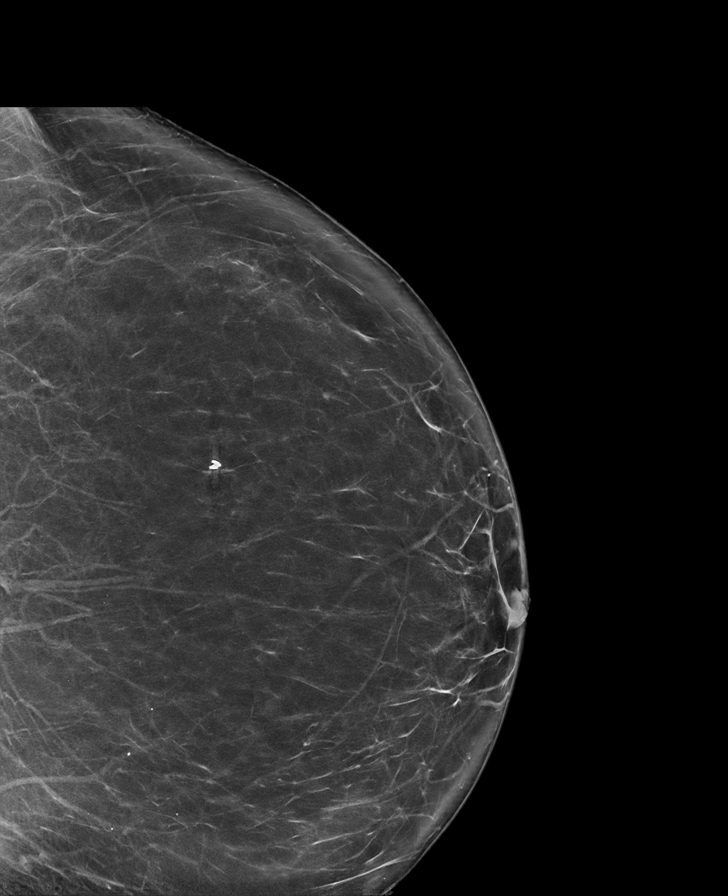

[L MLO synth-2D (2 of 2)]
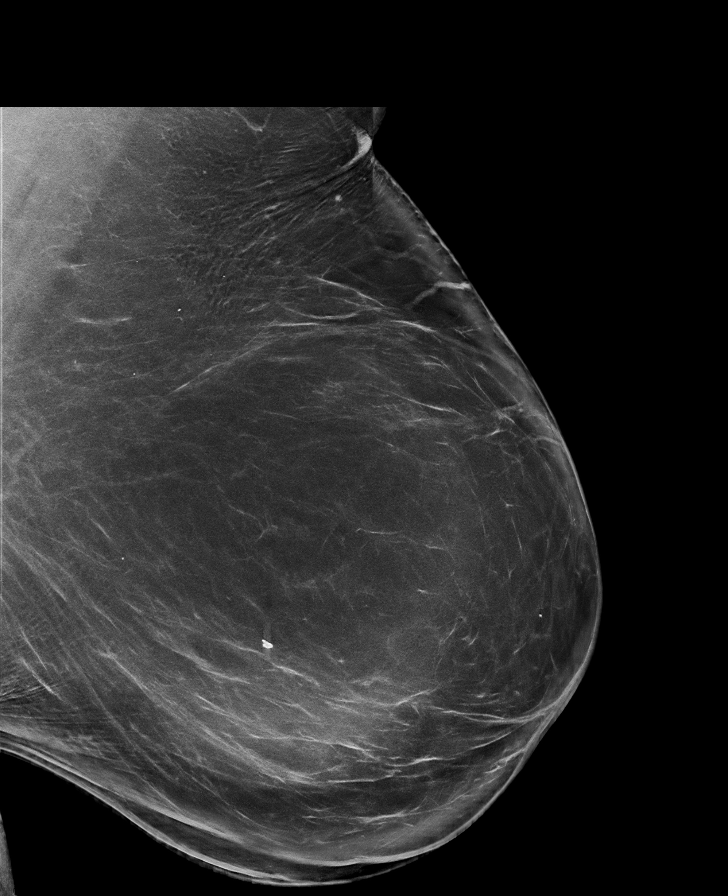

[L CC tomo · tomo slice 47/92.0]
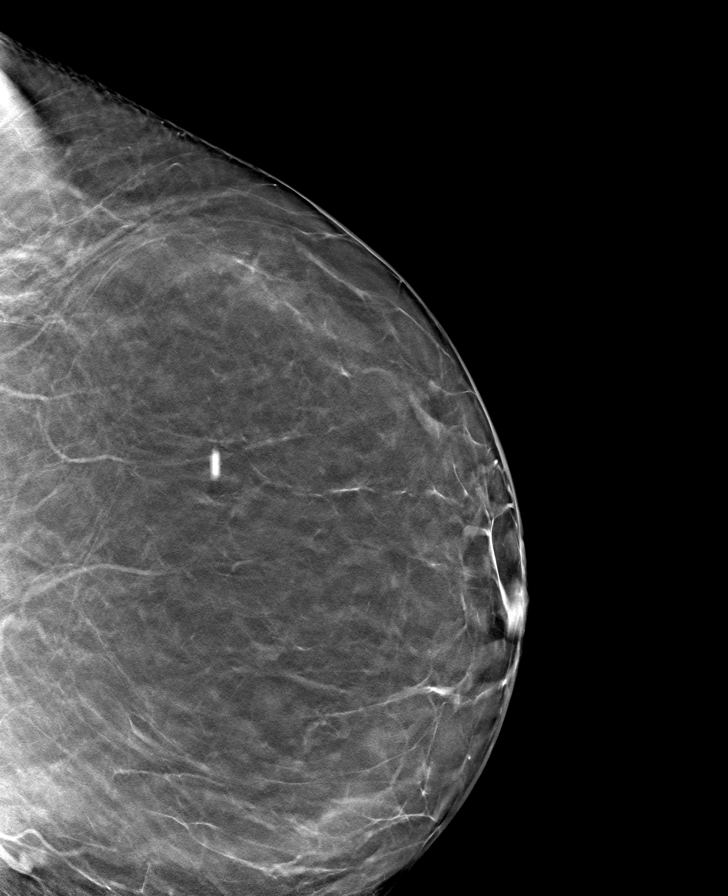

[8 of 40 positions shown; findings below may reference images not displayed]

FINDINGS: There are no findings suspicious for malignancy. Images were
processed with CAD.
IMPRESSION: No mammographic evidence of malignancy. A result letter of this
screening mammogram will be mailed directly to the patient.

RECOMMENDATION:
Screening mammogram in one year. (Code:[id number]-[id number])

BI-RADS CATEGORY  1: Negative.
